# Patient Record
Sex: Female | Born: 1990 | Race: Black or African American | Hispanic: No | Marital: Single | Smoking: Former smoker
Health system: Southern US, Community
[De-identification: ages and names within clinical notes are randomized; demographics above are authoritative.]

## PROBLEM LIST (undated history)

## (undated) DIAGNOSIS — E0500 Thyrotoxicosis with diffuse goiter without thyrotoxic crisis or storm: Secondary | ICD-10-CM

## (undated) DIAGNOSIS — I10 Essential (primary) hypertension: Secondary | ICD-10-CM

## (undated) DIAGNOSIS — F419 Anxiety disorder, unspecified: Secondary | ICD-10-CM

## (undated) DIAGNOSIS — E0590 Thyrotoxicosis, unspecified without thyrotoxic crisis or storm: Secondary | ICD-10-CM

## (undated) HISTORY — DX: Anxiety disorder, unspecified: F41.9

## (undated) HISTORY — PX: OTHER SURGICAL HISTORY: SHX169

## (undated) HISTORY — PX: NO PAST SURGERIES: SHX2092

---
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
## 2011-09-28 ENCOUNTER — Encounter (HOSPITAL_COMMUNITY): Payer: Self-pay | Admitting: Emergency Medicine

## 2011-09-28 ENCOUNTER — Emergency Department (HOSPITAL_COMMUNITY)
Admission: EM | Admit: 2011-09-28 | Discharge: 2011-09-29 | Disposition: A | Discharge status: Home / Self Care | Payer: Managed Care, Other (non HMO) | Attending: Emergency Medicine | Admitting: Emergency Medicine

## 2011-09-28 ENCOUNTER — Emergency Department (HOSPITAL_COMMUNITY)
Admission: EM | Admit: 2011-09-28 | Discharge: 2011-09-28 | Disposition: A | Discharge status: Home / Self Care | Payer: Medicaid Other | Source: Home / Self Care | Attending: Emergency Medicine | Admitting: Emergency Medicine

## 2011-09-28 DIAGNOSIS — E860 Dehydration: Secondary | ICD-10-CM | POA: Insufficient documentation

## 2011-09-28 DIAGNOSIS — Z91013 Allergy to seafood: Secondary | ICD-10-CM | POA: Insufficient documentation

## 2011-09-28 DIAGNOSIS — R1011 Right upper quadrant pain: Secondary | ICD-10-CM | POA: Insufficient documentation

## 2011-09-28 DIAGNOSIS — R109 Unspecified abdominal pain: Secondary | ICD-10-CM

## 2011-09-28 DIAGNOSIS — R1110 Vomiting, unspecified: Secondary | ICD-10-CM | POA: Insufficient documentation

## 2011-09-28 DIAGNOSIS — K819 Cholecystitis, unspecified: Secondary | ICD-10-CM

## 2011-09-28 LAB — URINALYSIS, MICROSCOPIC ONLY
Glucose, UA: NEGATIVE mg/dL
Hgb urine dipstick: NEGATIVE
Ketones, ur: NEGATIVE mg/dL
Leukocytes, UA: NEGATIVE
Nitrite: NEGATIVE
Urobilinogen, UA: 0.2 mg/dL (ref 0.0–1.0)
pH: 6.5 (ref 5.0–8.0)

## 2011-09-28 LAB — CBC WITH DIFFERENTIAL/PLATELET
Basophils Absolute: 0 10*3/uL (ref 0.0–0.1)
Lymphocytes Relative: 44 % (ref 12–46)
Neutro Abs: 3 10*3/uL (ref 1.7–7.7)
Neutrophils Relative %: 49 % (ref 43–77)
Platelets: 372 10*3/uL (ref 150–400)
RDW: 11.4 % — ABNORMAL LOW (ref 11.5–15.5)
WBC: 6.2 10*3/uL (ref 4.0–10.5)

## 2011-09-28 LAB — COMPREHENSIVE METABOLIC PANEL
ALT: 10 U/L (ref 0–35)
AST: 16 U/L (ref 0–37)
CO2: 28 mEq/L (ref 19–32)
Chloride: 103 mEq/L (ref 96–112)
GFR calc non Af Amer: >90 mL/min (ref >90)
Sodium: 140 mEq/L (ref 135–145)
Total Bilirubin: 0.6 mg/dL (ref 0.3–1.2)

## 2011-09-28 MED ORDER — ONDANSETRON HCL 4 MG/2ML IJ SOLN
4.0000 mg | Freq: Once | INTRAMUSCULAR | Status: AC
  Administered 2011-09-29: 4 mg via INTRAVENOUS
  Filled 2011-09-28: qty 2

## 2011-09-28 MED ORDER — SODIUM CHLORIDE 0.9 % IV BOLUS (SEPSIS)
1000.0000 mL | Freq: Once | INTRAVENOUS | Status: AC
  Administered 2011-09-29: 1000 mL via INTRAVENOUS

## 2011-09-28 MED ORDER — HYDROMORPHONE HCL PF 1 MG/ML IJ SOLN
1.0000 mg | Freq: Once | INTRAMUSCULAR | Status: AC
  Administered 2011-09-29: 1 mg via INTRAVENOUS
  Filled 2011-09-28: qty 1

## 2011-09-28 NOTE — ED Provider Notes (Signed)
History     CSN: [id number]  Arrival date & time 09/28/11  2034   First MD Initiated Contact with Patient 09/28/11 2350      Chief Complaint  Patient presents with  . Abdominal Pain    (Consider location/radiation/quality/duration/timing/severity/associated sxs/prior treatment) HPI Comments: 21 year old female with no past medical history who presents with a complaint of right upper quadrant pain. The onset was acute, sharp and stabbing in nature, intermittent and located in the right upper quadrant. It has been associated with 2 episodes of vomiting today, one in the mid afternoon and one just on arrival. She denies any changes in her urinary habits and no changes in bowel patterns, no diarrhea or rectal bleeding and no dysuria though she has noted that her urine has become dark over the last several days. She does admit to having very little appetite over the last week and has had nothing to eat or drink today prior to arrival. She has not had any prior abdominal surgery or internal abdominal problems. She denies being pregnant. She was seen at urgent care prior to arrival and sent for further evaluation.  Patient is a 21 y.o. female presenting with abdominal pain. The history is provided by the patient and medical records.  Abdominal Pain The primary symptoms of the illness include abdominal pain.    History reviewed. No pertinent past medical history.  History reviewed. No pertinent past surgical history.  History reviewed. No pertinent family history.  History  Substance Use Topics  . Smoking status: Never Smoker   . Smokeless tobacco: Not on file  . Alcohol Use: No    OB History    Grav Para Term Preterm Abortions TAB SAB Ect Mult Living                  Review of Systems  Gastrointestinal: Positive for abdominal pain.  All other systems reviewed and are negative.    Allergies  Iodine and Shellfish allergy  Home Medications   Current Outpatient Rx  Name  Route Sig Dispense Refill  . VITAMIN D-3 PO Oral Take 1 tablet by mouth daily.    Marland Kitchen HYDROCORTISONE 1 % EX CREA Topical Apply 1 application topically daily. For eczema    . NORGESTIM-ETH ESTRAD TRIPHASIC 0.18/0.215/0.25 MG-35 MCG PO TABS Oral Take 1 tablet by mouth daily.    Marland Kitchen NAPROXEN 500 MG PO TABS Oral Take 1 tablet (500 mg total) by mouth 2 (two) times daily with a meal. 30 tablet 0  . PROMETHAZINE HCL 25 MG PO TABS Oral Take 1 tablet (25 mg total) by mouth every 6 (six) hours as needed for nausea. 12 tablet 0    BP 104/54  Pulse 79  Temp 98.3 F (36.8 C) (Oral)  Resp 16  SpO2 100%  LMP 09/19/2011  Physical Exam  Nursing note and vitals reviewed. Constitutional: She appears well-developed and well-nourished. No distress.  HENT:  Head: Normocephalic and atraumatic.  Mouth/Throat: Oropharynx is clear and moist. No oropharyngeal exudate.  Eyes: Conjunctivae normal and EOM are normal. Pupils are equal, round, and reactive to light. Right eye exhibits no discharge. Left eye exhibits no discharge. No scleral icterus.  Neck: Normal range of motion. Neck supple. No JVD present. No thyromegaly present.  Cardiovascular: Normal rate, regular rhythm, normal heart sounds and intact distal pulses.  Exam reveals no gallop and no friction rub.   No murmur heard. Pulmonary/Chest: Effort normal and breath sounds normal. No respiratory distress. She has no wheezes. She  has no rales.  Abdominal: Soft. Bowel sounds are normal. She exhibits no distension and no mass. There is tenderness ( Focal right upper quadrant tenderness with guarding).       No pain at McBurney's point, no CVA tenderness, no other abdominal tenderness, normal bowel sounds  Musculoskeletal: Normal range of motion. She exhibits no edema and no tenderness.  Lymphadenopathy:    She has no cervical adenopathy.  Neurological: She is alert. Coordination normal.  Skin: Skin is warm and dry. No rash noted. No erythema.  Psychiatric: She  has a normal mood and affect. Her behavior is normal.    ED Course  Procedures (including critical care time)  Labs Reviewed  CBC WITH DIFFERENTIAL - Abnormal; Notable for the following:    RDW 11.4 (*)     All other components within normal limits  URINALYSIS, WITH MICROSCOPIC - Abnormal; Notable for the following:    APPearance CLOUDY (*)     Squamous Epithelial / LPF MANY (*)     All other components within normal limits  COMPREHENSIVE METABOLIC PANEL  LIPASE, BLOOD  HCG, SERUM, QUALITATIVE  LAB REPORT - SCANNED   US Abdomen Complete  09/29/2011  *RADIOLOGY REPORT*  Clinical Data:  21 year old female with right upper quadrant abdominal pain, nausea and vomiting.  ABDOMINAL ULTRASOUND COMPLETE  Comparison:  None.  Findings:  Gallbladder:  The gallbladder is unremarkable. There is no evidence of gallstones, gallbladder wall thickening, or pericholecystic fluid.  Common Bile Duct:  There is no evidence of intrahepatic or extrahepatic biliary dilation. The CBD measures 4.2 mm in greatest diameter.  Liver:  The liver is within normal limits in parenchymal echogenicity. No focal abnormalities are identified.  IVC:  Appears normal.  Pancreas:  Although the pancreas is difficult to visualize in its entirety, no focal pancreatic abnormality is identified.  Spleen:  Within normal limits in size and echotexture.  Right kidney:  The right kidney is normal in size and parenchymal echogenicity.  There is no evidence of solid mass, hydronephrosis or definite renal calculi.  The right kidney measures 9.1 cm.  Left kidney:  The left kidney is normal in size and parenchymal echogenicity.  There is no evidence of solid mass, hydronephrosis or definite renal calculi.   The left kidney measures 9.9 cm.  Abdominal Aorta:  No abdominal aortic aneurysm identified.  There is no evidence of ascites.  IMPRESSION: Negative abdominal ultrasound.   Original Report Authenticated By: Rosendo Gros, M.D.      1. Abdominal  pain       MDM  Overall the patient is in no acute distress though she does likely have some mild dehydration secondary to decreased intake, also possibly has cholecystitis no review of her lab work shows a normal white blood cell count of 6200, normal lipase and normal liver function tests. Her urinalysis shows mild dehydration but no ketones, serum hCG pending.  Abdominal pain improved, patient aware of her results including an ultrasound showing no signs of acute cholecystitis, patient stable for discharge.      Vida Roller, MD 09/29/11 [phone]

## 2011-09-28 NOTE — ED Notes (Signed)
C/o mid abd pain that began this afternoon. Denies vag d/c or urinary freq or burning.

## 2011-09-28 NOTE — ED Provider Notes (Signed)
Chief Complaint  Patient presents with  . Abdominal Pain    History of Present Illness:    The patient is a 21 year old female with a history since last night a sharp, stabbing, severe right upper quadrant abdominal pain without radiation. The pain went away then came back again. It is rated 9/10 in intensity. Nothing makes it worse. Specifically is not worse with food or with eating. She did get some relief of vomiting on one occasion. She denies any fever, chills, or sweats. She has felt nauseated and vomited 2 or 3 times. No hematemesis, coffee-ground emesis, or bilious emesis. She denies constipation, diarrhea, hematochezia, or melena. No urinary symptoms. No GYN complaints. Last menstrual period started last Wednesday. She is sexually active and uses birth control pills. She has a history since she was in high school of excessive vomiting. She had a extensive workup for this including endoscopy and a CT scan. Nothing was found it was felt that this might be due to stress. There is a positive family history of gallstones. She is allergic to shellfish and iodine.  Review of Systems:  Other than noted above, the patient denies any of the following symptoms: Constitutional:  No fever, chills, fatigue, weight loss or anorexia. Lungs:  No cough or shortness of breath. Heart:  No chest pain, palpitations, syncope or edema.  No cardiac history. Abdomen:  No nausea, vomiting, hematememesis, melena, diarrhea, or hematochezia. GU:  No dysuria, frequency, urgency, or hematuria. Gyn:  No vaginal discharge, itching, abnormal bleeding, dyspareunia, or pelvic pain.  PMFSH:  Past medical history, family history, social history, meds, and allergies were reviewed along with nurse's notes.  No prior abdominal surgeries, past history of GI problems, STDs or GYN problems.  No history of aspirin or NSAID use.  No excessive alcohol intake.  Physical Exam:   Vital signs:  BP 131/85  Pulse 85  Temp 98.9 F (37.2 C)  (Oral)  Resp 16  SpO2 98%  LMP 09/19/2011 Gen:  Alert, oriented, in no distress. Lungs:  Breath sounds clear and equal bilaterally.  No wheezes, rales or rhonchi. Heart:  Regular rhythm.  No gallops or murmers.   Abdomen:  She has localized tenderness to palpation in the right upper quadrant with guarding and rebound. She has positive Murphy sign and Murphy's punch. Abdomen is flat and nondistended. Bowel sounds are present. No organomegaly or mass. Skin:  Clear, warm and dry.  No rash.   Assessment:  The encounter diagnosis was Cholecystitis.  Differential diagnosis includes ulcer disease or gallstones. She will need an ultrasound tonight.  Plan:   1.  The following meds were prescribed:   New Prescriptions   No medications on file   2.  The patient was transferred to the emergency department via shuttle.  Reuben Likes, MD 09/28/11 2030

## 2011-09-28 NOTE — ED Notes (Signed)
Reports severe pain in abd last night, started to have n/v today, and pain started to get better, but later this afternoon, severe pain came back--pain in RUQ

## 2011-09-29 ENCOUNTER — Emergency Department (HOSPITAL_COMMUNITY): Payer: Managed Care, Other (non HMO)

## 2011-09-29 MED ORDER — NAPROXEN 500 MG PO TABS: 500.0000 mg | ORAL_TABLET | Freq: Two times a day (BID) | ORAL | Status: AC

## 2011-09-29 MED ORDER — PROMETHAZINE HCL 25 MG PO TABS: 25.0000 mg | ORAL_TABLET | Freq: Four times a day (QID) | ORAL | Status: DC | PRN

## 2011-09-29 NOTE — ED Notes (Signed)
Pt from [facility] for ultrasound - pt reports RUQ w/ associating n/v that began yesterday evening. Pt denies diarrhea or fever.

## 2011-12-04 ENCOUNTER — Encounter (HOSPITAL_COMMUNITY): Payer: Self-pay | Admitting: *Deleted

## 2011-12-04 ENCOUNTER — Emergency Department (HOSPITAL_COMMUNITY)
Admission: EM | Admit: 2011-12-04 | Discharge: 2011-12-04 | Disposition: A | Discharge status: Home / Self Care | Payer: Managed Care, Other (non HMO) | Source: Home / Self Care | Attending: Family Medicine | Admitting: Family Medicine

## 2011-12-04 DIAGNOSIS — K5289 Other specified noninfective gastroenteritis and colitis: Secondary | ICD-10-CM

## 2011-12-04 DIAGNOSIS — K529 Noninfective gastroenteritis and colitis, unspecified: Secondary | ICD-10-CM

## 2011-12-04 LAB — POCT URINALYSIS DIP (DEVICE)
Bilirubin Urine: NEGATIVE
Ketones, ur: NEGATIVE mg/dL
Leukocytes, UA: NEGATIVE
Specific Gravity, Urine: 1.025 (ref 1.005–1.030)
pH: 7 (ref 5.0–8.0)

## 2011-12-04 LAB — POCT PREGNANCY, URINE: Preg Test, Ur: NEGATIVE

## 2011-12-04 MED ORDER — ONDANSETRON 4 MG PO TBDP
8.0000 mg | ORAL_TABLET | Freq: Once | ORAL | Status: AC
  Administered 2011-12-04: 8 mg via ORAL

## 2011-12-04 MED ORDER — ONDANSETRON HCL 4 MG/2ML IJ SOLN
INTRAMUSCULAR | Status: AC
  Filled 2011-12-04: qty 2

## 2011-12-04 MED ORDER — ONDANSETRON HCL 4 MG/2ML IJ SOLN
4.0000 mg | Freq: Once | INTRAMUSCULAR | Status: AC
  Administered 2011-12-04: 4 mg via INTRAMUSCULAR

## 2011-12-04 MED ORDER — METOCLOPRAMIDE HCL 5 MG/ML IJ SOLN
10.0000 mg | Freq: Once | INTRAMUSCULAR | Status: AC
  Administered 2011-12-04: 10 mg via INTRAMUSCULAR

## 2011-12-04 MED ORDER — ONDANSETRON 4 MG PO TBDP
ORAL_TABLET | ORAL | Status: AC
  Filled 2011-12-04: qty 2

## 2011-12-04 MED ORDER — ONDANSETRON HCL 4 MG PO TABS: 4.0000 mg | ORAL_TABLET | Freq: Four times a day (QID) | ORAL | Status: DC

## 2011-12-04 MED ORDER — METOCLOPRAMIDE HCL 5 MG/ML IJ SOLN
INTRAMUSCULAR | Status: AC
  Filled 2011-12-04: qty 2

## 2011-12-04 NOTE — ED Provider Notes (Signed)
History     CSN: [id number]  Arrival date & time 12/04/11  1421   None     Chief Complaint  Patient presents with  . Diarrhea    (Consider location/radiation/quality/duration/timing/severity/associated sxs/prior treatment) Patient is a 21 y.o. female presenting with diarrhea. The history is provided by the patient.  Diarrhea The primary symptoms include nausea, vomiting and diarrhea. The illness began 3 to 5 days ago. The onset was sudden. The problem has not changed since onset. The diarrhea is watery, semi-solid and malodorous. The diarrhea occurs 2 to 4 times per day. Risk factors for illness producing diarrhea include suspect food intake.  The illness is also significant for chills and tenesmus. The illness does not include anorexia, bloating, constipation or back pain. Associated medical issues do not include inflammatory bowel disease, GERD, gallstones, liver disease, bowel resection, irritable bowel syndrome or diverticulitis.    History reviewed. No pertinent past medical history.  History reviewed. No pertinent past surgical history.  No family history on file.  History  Substance Use Topics  . Smoking status: Never Smoker   . Smokeless tobacco: Not on file  . Alcohol Use: No    OB History    Grav Para Term Preterm Abortions TAB SAB Ect Mult Living                  Review of Systems  Constitutional: Positive for chills.  Gastrointestinal: Positive for nausea, vomiting and diarrhea. Negative for constipation, bloating and anorexia.  Musculoskeletal: Negative for back pain.  All other systems reviewed and are negative.    Allergies  Iodine and Shellfish allergy  Home Medications   Current Outpatient Rx  Name  Route  Sig  Dispense  Refill  . VITAMIN D-3 PO   Oral   Take 1 tablet by mouth daily.         Marland Kitchen HYDROCORTISONE 1 % EX CREA   Topical   Apply 1 application topically daily. For eczema         . NAPROXEN 500 MG PO TABS   Oral   Take 1  tablet (500 mg total) by mouth 2 (two) times daily with a meal.   30 tablet   0   . NORGESTIM-ETH ESTRAD TRIPHASIC 0.18/0.215/0.25 MG-35 MCG PO TABS   Oral   Take 1 tablet by mouth daily.         Marland Kitchen ONDANSETRON HCL 4 MG PO TABS   Oral   Take 1 tablet (4 mg total) by mouth every 6 (six) hours.   12 tablet   0   . PROMETHAZINE HCL 25 MG PO TABS   Oral   Take 1 tablet (25 mg total) by mouth every 6 (six) hours as needed for nausea.   12 tablet   0     BP 120/85  Pulse 85  Temp 98.8 F (37.1 C) (Oral)  Resp 16  SpO2 99%  LMP 11/18/2011  Physical Exam  Nursing note and vitals reviewed. Constitutional: She is oriented to person, place, and time. Vital signs are normal. She appears well-developed and well-nourished. She is active and cooperative.  HENT:  Head: Normocephalic.  Mouth/Throat: Oropharynx is clear and moist. No oropharyngeal exudate.  Eyes: Conjunctivae normal are normal. Pupils are equal, round, and reactive to light. No scleral icterus.  Neck: Trachea normal and normal range of motion. Neck supple.  Cardiovascular: Normal rate, regular rhythm, normal heart sounds and intact distal pulses.   Pulmonary/Chest: Effort normal and breath sounds  normal.  Abdominal: Soft. Bowel sounds are normal. There is tenderness. There is no guarding.  Musculoskeletal: Normal range of motion.  Lymphadenopathy:    She has no cervical adenopathy.  Neurological: She is alert and oriented to person, place, and time. No cranial nerve deficit or sensory deficit.  Skin: Skin is warm and dry.  Psychiatric: She has a normal mood and affect. Her speech is normal and behavior is normal. Judgment and thought content normal. Cognition and memory are normal.    ED Course  Procedures (including critical care time)  Labs Reviewed  POCT URINALYSIS DIP (DEVICE) - Abnormal; Notable for the following:    Protein, ur 30 (*)     All other components within normal limits  POCT PREGNANCY, URINE    No results found.   1. Gastroenteritis       MDM  Zofran ODT administered in office, unable to tolerate PO fluids Zofran 4mg  IM administered, vomiting post administration. Reglan 10mg  administered, oral trial, tolerated well.  Driver at bedside. Rx provided.  Clear liquid diet, then BRAT, progress to regular as tolerated.         Johnsie Kindred, NP 12/04/11 1706  Johnsie Kindred, NP 12/04/11 1926

## 2011-12-04 NOTE — ED Provider Notes (Signed)
Medical screening examination/treatment/procedure(s) were performed by resident physician or non-physician practitioner and as supervising physician I was immediately available for consultation/collaboration.   Vyom Brass DOUGLAS MD.    Lailynn Southgate D Yomar Mejorado, MD 12/04/11 2132 

## 2011-12-04 NOTE — ED Notes (Signed)
Pt  Reports  Symptoms  Of  abd  Pain  With  Nausea   Vomiting  And  Diarrhea    X  4  Days  Pt  And  Friend  Got  Sick  sev  Hours  After  Eating  [place]  She  States  -  She  Vomited  yest   However  None  Today  But has  Nausea   And  some  Diarrhea

## 2011-12-04 NOTE — ED Notes (Signed)
Report     Given to  [facility]

## 2011-12-04 NOTE — ED Notes (Signed)
Pt   Vomited      After  Po  zofram  carman  notified

## 2011-12-05 NOTE — ED Notes (Signed)
Called, tearful; c/o she is not able to keep down any PO's at all, that the Zofran PO is not helping her nausea at all advised if unable to keep down fluids , she should be rechecked

## 2011-12-06 ENCOUNTER — Emergency Department (HOSPITAL_COMMUNITY): Payer: Managed Care, Other (non HMO)

## 2011-12-06 ENCOUNTER — Emergency Department (HOSPITAL_COMMUNITY)
Admission: EM | Admit: 2011-12-06 | Discharge: 2011-12-07 | Disposition: A | Discharge status: Home / Self Care | Payer: Managed Care, Other (non HMO) | Attending: Emergency Medicine | Admitting: Emergency Medicine

## 2011-12-06 ENCOUNTER — Encounter (HOSPITAL_COMMUNITY): Payer: Self-pay | Admitting: Emergency Medicine

## 2011-12-06 DIAGNOSIS — R112 Nausea with vomiting, unspecified: Secondary | ICD-10-CM | POA: Insufficient documentation

## 2011-12-06 DIAGNOSIS — R109 Unspecified abdominal pain: Secondary | ICD-10-CM | POA: Insufficient documentation

## 2011-12-06 DIAGNOSIS — K5900 Constipation, unspecified: Secondary | ICD-10-CM | POA: Insufficient documentation

## 2011-12-06 DIAGNOSIS — Z79899 Other long term (current) drug therapy: Secondary | ICD-10-CM | POA: Insufficient documentation

## 2011-12-06 LAB — COMPREHENSIVE METABOLIC PANEL
ALT: 11 U/L (ref 0–35)
AST: 15 U/L (ref 0–37)
Alkaline Phosphatase: 61 U/L (ref 39–117)
CO2: 26 mEq/L (ref 19–32)
Calcium: 9.2 mg/dL (ref 8.4–10.5)
Chloride: 102 mEq/L (ref 96–112)
GFR calc non Af Amer: >90 mL/min (ref >90)
Potassium: 3.3 mEq/L — ABNORMAL LOW (ref 3.5–5.1)
Sodium: 138 mEq/L (ref 135–145)
Total Bilirubin: 0.8 mg/dL (ref 0.3–1.2)

## 2011-12-06 LAB — WET PREP, GENITAL

## 2011-12-06 LAB — URINALYSIS, ROUTINE W REFLEX MICROSCOPIC
Hgb urine dipstick: NEGATIVE
Nitrite: NEGATIVE
Specific Gravity, Urine: 1.028 (ref 1.005–1.030)
Urobilinogen, UA: 1 mg/dL (ref 0.0–1.0)
pH: 6 (ref 5.0–8.0)

## 2011-12-06 LAB — CBC WITH DIFFERENTIAL/PLATELET
Basophils Absolute: 0 10*3/uL (ref 0.0–0.1)
Lymphocytes Relative: 40 % (ref 12–46)
Neutro Abs: 3.3 10*3/uL (ref 1.7–7.7)
Platelets: 355 10*3/uL (ref 150–400)
RBC: 3.79 MIL/uL — ABNORMAL LOW (ref 3.87–5.11)
RDW: 11.3 % — ABNORMAL LOW (ref 11.5–15.5)
WBC: 6.1 10*3/uL (ref 4.0–10.5)

## 2011-12-06 LAB — URINE MICROSCOPIC-ADD ON

## 2011-12-06 LAB — PREGNANCY, URINE: Preg Test, Ur: NEGATIVE

## 2011-12-06 MED ORDER — PIPERACILLIN-TAZOBACTAM 3.375 G IVPB 30 MIN
3.3750 g | Freq: Once | INTRAVENOUS | Status: AC
  Administered 2011-12-06: 3.375 g via INTRAVENOUS
  Filled 2011-12-06: qty 50

## 2011-12-06 MED ORDER — MORPHINE SULFATE 4 MG/ML IJ SOLN
4.0000 mg | Freq: Once | INTRAMUSCULAR | Status: AC
  Administered 2011-12-06: 4 mg via INTRAVENOUS
  Filled 2011-12-06: qty 1

## 2011-12-06 MED ORDER — SODIUM CHLORIDE 0.9 % IV BOLUS (SEPSIS)
1000.0000 mL | Freq: Once | INTRAVENOUS | Status: AC
  Administered 2011-12-06: 1000 mL via INTRAVENOUS

## 2011-12-06 MED ORDER — POTASSIUM CHLORIDE 10 MEQ/100ML IV SOLN
10.0000 meq | INTRAVENOUS | Status: DC
  Administered 2011-12-06: 10 meq via INTRAVENOUS
  Filled 2011-12-06: qty 100

## 2011-12-06 MED ORDER — HYDROMORPHONE HCL PF 2 MG/ML IJ SOLN
2.0000 mg | INTRAMUSCULAR | Status: DC | PRN
  Administered 2011-12-06 (×2): 2 mg via INTRAVENOUS
  Filled 2011-12-06 (×2): qty 1

## 2011-12-06 MED ORDER — HYDROMORPHONE HCL PF 1 MG/ML IJ SOLN
1.0000 mg | Freq: Once | INTRAMUSCULAR | Status: AC
  Administered 2011-12-06: 1 mg via INTRAVENOUS
  Filled 2011-12-06: qty 1

## 2011-12-06 MED ORDER — ONDANSETRON HCL 4 MG/2ML IJ SOLN
4.0000 mg | Freq: Once | INTRAMUSCULAR | Status: AC
  Administered 2011-12-06: 4 mg via INTRAVENOUS
  Filled 2011-12-06: qty 2

## 2011-12-06 NOTE — ED Provider Notes (Signed)
Lab, CT, and [place] results reviewed, discussed with Dr. Patria Mane and discussed with patient.  No clear cause of abdominal pain identified.  Based on symptom presentation of diarrhea followed with nausea and vomiting and cramping pain, suspect gastroenteritis.  Will provide prescription for anti-emetic and analgesia.  Clear liquids for 24 hours, progressing diet as tolerated as symptom resolution allows.  Jimmye Norman, NP 12/06/11 2350

## 2011-12-06 NOTE — ED Notes (Signed)
Patient transported to Ultrasound 

## 2011-12-06 NOTE — ED Provider Notes (Signed)
History     CSN: [id number]  Arrival date & time 12/06/11  1622   First MD Initiated Contact with Patient 12/06/11 1629      Chief Complaint  Patient presents with  . Abdominal Pain  . Emesis    (Consider location/radiation/quality/duration/timing/severity/associated sxs/prior treatment) HPI  Miranda Adams is a 21 y.o. female complaining of  nausea vomiting and lower abdominal pain for 7 days. Patient was seen at urgent care and given Zofran with little relief. She has been constipated denies any change in her urination other than the urine is dark. Patient denies fever, decreased appetite, vaginal discharge. She's not tolerating any by mouth at this time. Pain is rated as severe 8/10.   History reviewed. No pertinent past medical history.  History reviewed. No pertinent past surgical history.  History reviewed. No pertinent family history.  History  Substance Use Topics  . Smoking status: Never Smoker   . Smokeless tobacco: Not on file  . Alcohol Use: No    OB History    Grav Para Term Preterm Abortions TAB SAB Ect Mult Living                  Review of Systems  Constitutional: Negative for fever.  Respiratory: Negative for shortness of breath.   Cardiovascular: Negative for chest pain.  Gastrointestinal: Positive for nausea, vomiting, abdominal pain and constipation. Negative for diarrhea.  All other systems reviewed and are negative.    Allergies  Iodine and Shellfish allergy  Home Medications   Current Outpatient Rx  Name  Route  Sig  Dispense  Refill  . VITAMIN D-3 PO   Oral   Take 1 tablet by mouth daily.         Marland Kitchen HYDROCORTISONE 1 % EX CREA   Topical   Apply 1 application topically daily. For eczema         . NAPROXEN 500 MG PO TABS   Oral   Take 1 tablet (500 mg total) by mouth 2 (two) times daily with a meal.   30 tablet   0   . NORGESTIM-ETH ESTRAD TRIPHASIC 0.18/0.215/0.25 MG-35 MCG PO TABS   Oral   Take 1 tablet by mouth  daily.         Marland Kitchen ONDANSETRON HCL 4 MG PO TABS   Oral   Take 4 mg by mouth every 6 (six) hours as needed. For nausea/vomiting           BP 138/62  Pulse 83  Temp 98.1 F (36.7 C) (Oral)  Resp 16  SpO2 100%  LMP 11/18/2011  Physical Exam  Nursing note and vitals reviewed. Constitutional: She is oriented to person, place, and time. She appears well-developed and well-nourished. No distress.  HENT:  Head: Normocephalic.  Eyes: Conjunctivae normal and EOM are normal.  Cardiovascular: Normal rate, regular rhythm and intact distal pulses.   Pulmonary/Chest: Breath sounds normal. No stridor. No respiratory distress. She has no wheezes. She has no rales. She exhibits no tenderness.  Abdominal: Soft. Bowel sounds are normal. She exhibits no distension and no mass. There is tenderness. There is no rebound and no guarding.       TTP of RLQ, -rovsing, +psoas, +obturator  Genitourinary:       Pelvic exam chaperoned by RN. No abnormal discharge, cervical motion or adnexal tenderness.  Musculoskeletal: Normal range of motion.  Neurological: She is alert and oriented to person, place, and time.  Psychiatric: She has a normal mood and affect.  ED Course  Procedures (including critical care time)  Labs Reviewed  URINALYSIS, ROUTINE W REFLEX MICROSCOPIC - Abnormal; Notable for the following:    Color, Urine AMBER (*)  BIOCHEMICALS MAY BE AFFECTED BY COLOR   APPearance CLOUDY (*)     Bilirubin Urine SMALL (*)     Ketones, ur 15 (*)     Protein, ur 30 (*)     All other components within normal limits  CBC WITH DIFFERENTIAL - Abnormal; Notable for the following:    RBC 3.79 (*)     Hemoglobin 11.7 (*)     HCT 33.9 (*)     RDW 11.3 (*)     All other components within normal limits  COMPREHENSIVE METABOLIC PANEL - Abnormal; Notable for the following:    Potassium 3.3 (*)     Glucose, Bld 113 (*)     All other components within normal limits  WET PREP, GENITAL - Abnormal; Notable  for the following:    Clue Cells Wet Prep HPF POC FEW (*)     WBC, Wet Prep HPF POC FEW (*)     All other components within normal limits  URINE MICROSCOPIC-ADD ON - Abnormal; Notable for the following:    Squamous Epithelial / LPF FEW (*)     Bacteria, UA FEW (*)     Casts HYALINE CASTS (*)  GRANULAR CAST   All other components within normal limits  PREGNANCY, URINE  POCT PREGNANCY, URINE   No results found.   No diagnosis found.    MDM  Pt with 7 days of N/V and abdominal pain.   5:57 PM Pt relavuated after morphine given pain is still 8/10. Emesis has resolved but she remains nauseated. We'll write her for 1 mg of Dilaudid.  Patient's pain remains severe at 8/10. I will write her for 2 mg of Dilaudid at this time.  GC and Chlamydia canceled as sample  was obtained on incorrect swab.  Concern for appendicitis. I will start her on Zosyn in order CT abdomen pelvis.   Patient's pain remains severe. She is unable to drink her oral contrast. I have spoken with the radiologist Dr. Pia Mau who has said that she would be appropriate for an Non-contrast  Patient will be transferred to the CDU pending imaging. Report given to NP [facility]  in the CDU.      Wynetta Emery, PA-C 12/08/11 [phone]

## 2011-12-06 NOTE — ED Notes (Signed)
Pt c/o lower abd pain with N/V x 1 week; pt sts LMP was 11/3; pt sts seen at [facility] this week for same

## 2011-12-07 MED ORDER — ONDANSETRON 8 MG PO TBDP: 8.0000 mg | ORAL_TABLET | Freq: Three times a day (TID) | ORAL | Status: DC | PRN

## 2011-12-07 MED ORDER — HYDROCODONE-ACETAMINOPHEN 5-325 MG PO TABS: 1.0000 | ORAL_TABLET | Freq: Four times a day (QID) | ORAL | Status: DC | PRN

## 2011-12-07 NOTE — ED Notes (Signed)
Friend out to nursing area and states that pt "passed out in the bathroom" and was "unresponsive for about 3 minutes."  Found pt lying on floor in bathroom alert and oriented.  Pt denies pain other than previous abd pain that she was having.  No obvious signs of injury.Denies hitting her head.  Two friends were with pt during incident and friend states that pt did not fall.  Friend reports that pt started to pass out and that she caught her and lowered her to the ground.  Denies fall and denies any injury.  Pt assisted back to bed and vitals taken.  Onalee Hua, NP notified of same. No new orders received.

## 2011-12-07 NOTE — ED Notes (Signed)
Friends report that pt is "going in and out of consciousness and shaking."  Pt is alert and oriented and talking to RN.  Pt states she is shaking because she is cold.  Warm blankets given and vitals WNL. Dr. Lavella Lemons notified of pt and will re-evaluate.

## 2011-12-07 NOTE — ED Provider Notes (Signed)
Medical screening examination/treatment/procedure(s) were conducted as a shared visit with non-physician practitioner(s) and myself.  I personally evaluated the patient during the encounter  Sent to CDU to await CT scan  Lyanne Co, MD 12/07/11 0004

## 2011-12-07 NOTE — ED Notes (Signed)
Dr. Lavella Lemons in to inform pt of normal test results.  Pt verbalized understanding.  Pt to discharge via wheelchair without any difficulties.

## 2011-12-08 ENCOUNTER — Encounter (HOSPITAL_COMMUNITY): Payer: Self-pay | Admitting: Emergency Medicine

## 2011-12-08 ENCOUNTER — Emergency Department (HOSPITAL_COMMUNITY)
Admission: EM | Admit: 2011-12-08 | Discharge: 2011-12-09 | Disposition: A | Discharge status: Home / Self Care | Payer: Managed Care, Other (non HMO) | Attending: Emergency Medicine | Admitting: Emergency Medicine

## 2011-12-08 DIAGNOSIS — R55 Syncope and collapse: Secondary | ICD-10-CM | POA: Insufficient documentation

## 2011-12-08 DIAGNOSIS — R51 Headache: Secondary | ICD-10-CM | POA: Insufficient documentation

## 2011-12-08 DIAGNOSIS — R110 Nausea: Secondary | ICD-10-CM | POA: Insufficient documentation

## 2011-12-08 DIAGNOSIS — Z3202 Encounter for pregnancy test, result negative: Secondary | ICD-10-CM | POA: Insufficient documentation

## 2011-12-08 DIAGNOSIS — R109 Unspecified abdominal pain: Secondary | ICD-10-CM | POA: Insufficient documentation

## 2011-12-08 LAB — URINALYSIS, ROUTINE W REFLEX MICROSCOPIC
Hgb urine dipstick: NEGATIVE
Nitrite: NEGATIVE
Protein, ur: 100 mg/dL — AB
Specific Gravity, Urine: 1.029 (ref 1.005–1.030)
Urobilinogen, UA: 2 mg/dL — ABNORMAL HIGH (ref 0.0–1.0)

## 2011-12-08 LAB — CBC WITH DIFFERENTIAL/PLATELET
Basophils Absolute: 0 10*3/uL (ref 0.0–0.1)
Basophils Relative: 0 % (ref 0–1)
Eosinophils Absolute: 0 10*3/uL (ref 0.0–0.7)
Eosinophils Relative: 1 % (ref 0–5)
MCH: 30.1 pg (ref 26.0–34.0)
MCHC: 34.3 g/dL (ref 30.0–36.0)
MCV: 87.8 fL (ref 78.0–100.0)
Neutrophils Relative %: 58 % (ref 43–77)
Platelets: 363 10*3/uL (ref 150–400)
RBC: 3.85 MIL/uL — ABNORMAL LOW (ref 3.87–5.11)
RDW: 11.4 % — ABNORMAL LOW (ref 11.5–15.5)

## 2011-12-08 LAB — BASIC METABOLIC PANEL
BUN: 7 mg/dL (ref 6–23)
CO2: 24 mEq/L (ref 19–32)
Calcium: 9.2 mg/dL (ref 8.4–10.5)
Creatinine, Ser: 0.81 mg/dL (ref 0.50–1.10)
GFR calc non Af Amer: >90 mL/min (ref >90)
Glucose, Bld: 82 mg/dL (ref 70–99)
Sodium: 137 mEq/L (ref 135–145)

## 2011-12-08 LAB — URINE MICROSCOPIC-ADD ON

## 2011-12-08 MED ORDER — HYOSCYAMINE SULFATE CR 0.375 MG PO CP12
0.3750 mg | ORAL_CAPSULE | Freq: Two times a day (BID) | ORAL | Status: DC | PRN
Start: 2011-12-08 — End: 2014-03-10

## 2011-12-08 MED ORDER — HYOSCYAMINE SULFATE 0.125 MG PO TABS
0.2500 mg | ORAL_TABLET | Freq: Once | ORAL | Status: AC
  Administered 2011-12-08: 0.25 mg via ORAL
  Filled 2011-12-08: qty 2

## 2011-12-08 MED ORDER — SODIUM CHLORIDE 0.9 % IV SOLN: INTRAVENOUS | Status: DC

## 2011-12-08 MED ORDER — SODIUM CHLORIDE 0.9 % IV BOLUS (SEPSIS)
1000.0000 mL | Freq: Once | INTRAVENOUS | Status: AC
  Administered 2011-12-08: 1000 mL via INTRAVENOUS

## 2011-12-08 MED ORDER — ONDANSETRON HCL 4 MG/2ML IJ SOLN
4.0000 mg | Freq: Once | INTRAMUSCULAR | Status: AC
  Administered 2011-12-08: 4 mg via INTRAVENOUS
  Filled 2011-12-08: qty 2

## 2011-12-08 NOTE — ED Provider Notes (Addendum)
History     CSN: [id number]  Arrival date & time 12/08/11  2048   First MD Initiated Contact with Patient 12/08/11 2129      Chief Complaint  Patient presents with  . Near Syncope    (Consider location/radiation/quality/duration/timing/severity/associated sxs/prior treatment) The history is provided by the patient and medical records.   the patient is a 21 year old, female, who presents to emergency department after she had a syncopal episode while at work.  She is a Child psychotherapist.  She says that she was working and she became hot and then, she fainted.  She states that she has had abdominal cramping, with nausea, vomiting, and diarrhea for the past week.  She says that she still has nausea and vomiting, but the diarrhea is now resolved.  She denies chest pain, shortness of breath.  Cough.  She denies fevers, chills, dysuria, or hematuria.  She denies vaginal bleeding or discharge.  History reviewed. No pertinent past medical history.  History reviewed. No pertinent past surgical history.  No family history on file.  History  Substance Use Topics  . Smoking status: Never Smoker   . Smokeless tobacco: Not on file  . Alcohol Use: No    OB History    Grav Para Term Preterm Abortions TAB SAB Ect Mult Living                  Review of Systems  Constitutional: Negative for fever, chills and diaphoresis.  HENT: Negative for nosebleeds.   Eyes: Negative for visual disturbance.  Respiratory: Negative for cough and shortness of breath.   Cardiovascular: Negative for chest pain and palpitations.  Gastrointestinal: Positive for nausea, vomiting and abdominal pain. Negative for diarrhea, constipation and blood in stool.  Genitourinary: Negative for dysuria, hematuria, vaginal bleeding and vaginal discharge.  Musculoskeletal: Negative for back pain.  Skin: Negative for rash.  Neurological: Positive for syncope and headaches.       Headache is only mild  Hematological: Does not  bruise/bleed easily.  Psychiatric/Behavioral: Negative for confusion.  All other systems reviewed and are negative.    Allergies  Iodine and Shellfish allergy  Home Medications   Current Outpatient Rx  Name  Route  Sig  Dispense  Refill  . HYDROCORTISONE 1 % EX CREA   Topical   Apply 1 application topically daily. For eczema         . NAPROXEN 500 MG PO TABS   Oral   Take 1 tablet (500 mg total) by mouth 2 (two) times daily with a meal.   30 tablet   0   . NORGESTIM-ETH ESTRAD TRIPHASIC 0.18/0.215/0.25 MG-35 MCG PO TABS   Oral   Take 1 tablet by mouth daily.         Marland Kitchen ONDANSETRON HCL 4 MG PO TABS   Oral   Take 4 mg by mouth every 6 (six) hours as needed. For nausea/vomiting           BP 125/69  Pulse 84  Temp 98.6 F (37 C) (Oral)  Resp 16  Ht 5\' 1"  (1.549 m)  Wt 145 lb (65.772 kg)  BMI 27.40 kg/m2  SpO2 100%  LMP 11/18/2011  Physical Exam  Nursing note and vitals reviewed. Constitutional: She is oriented to person, place, and time. She appears well-developed and well-nourished. No distress.  HENT:  Head: Normocephalic and atraumatic.  Eyes: Conjunctivae normal and EOM are normal.  Neck: Normal range of motion. Neck supple. No JVD present.  No carotid bruits  Cardiovascular: Normal rate, regular rhythm and intact distal pulses.   No murmur heard. Pulmonary/Chest: Effort normal and breath sounds normal. No respiratory distress. She has no rales.  Abdominal: Soft. Bowel sounds are normal. She exhibits no distension. There is tenderness. There is no rebound and no guarding.       Mild epigastric tenderness, with no peritoneal sign  Musculoskeletal: Normal range of motion. She exhibits no edema and no tenderness.  Neurological: She is alert and oriented to person, place, and time. No cranial nerve deficit.  Skin: Skin is warm and dry.  Psychiatric: She has a normal mood and affect. Thought content normal.    ED Course  Procedures (including  critical care time) 21 year old, female, had a syncopal episode while at work.  Recently, she had gastroenteritis.  Now.  Her diarrhea is resolved, but she still has nausea and vomiting.  We'll establish an IV give fluids, and perform laboratory testing, or evaluation   Labs Reviewed  URINALYSIS, ROUTINE W REFLEX MICROSCOPIC  PREGNANCY, URINE  BASIC METABOLIC PANEL  CBC WITH DIFFERENTIAL   US Transvaginal Non-ob  12/06/2011  *RADIOLOGY REPORT*  Clinical Data: Right lower quadrant pain  TRANSABDOMINAL AND TRANSVAGINAL ULTRASOUND OF PELVIS Technique:  Both transabdominal and transvaginal ultrasound examinations of the pelvis were performed. Transabdominal technique was performed for global imaging of the pelvis including uterus, ovaries, adnexal regions, and pelvic cul-de-sac.  It was necessary to proceed with endovaginal exam following the transabdominal exam to visualize the endometrium and adnexa.  Comparison:  12/06/2011 CT  Findings:  Uterus: Retroflexed with an exophytic fibroid anteriorly and posteriorly, measuring 1.2 cm and 1.1 cm respectively.  The uterus measures 5.5 x 3.7 x 4.4 cm.  Endometrium: Normal in thickness and appearance, measuring 3 mm.  Right ovary:  Normal appearance/no adnexal mass.  The ovary measures 3.5 x 2.5 x 2.5 cm.  Left ovary: Normal appearance/no adnexal mass.  The ovary measures 4.1 x 2.0 x 2.0 cm.  1.4 cm corpus luteal cyst.  Other findings: Small amount of free fluid within the cul-de-sac.  Color Doppler flow with arterial and venous waveforms documented to both ovaries.  IMPRESSION: Left corpus luteal cyst and small amount of free fluid within the pelvis, likely physiologic.  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  Color Doppler flow with arterial and venous waveforms documented to both ovaries.   Original Report Authenticated By: Jearld Lesch, M.D.    US Pelvis Complete  12/06/2011  *RADIOLOGY REPORT*  Clinical Data: Right lower quadrant pain   TRANSABDOMINAL AND TRANSVAGINAL ULTRASOUND OF PELVIS Technique:  Both transabdominal and transvaginal ultrasound examinations of the pelvis were performed. Transabdominal technique was performed for global imaging of the pelvis including uterus, ovaries, adnexal regions, and pelvic cul-de-sac.  It was necessary to proceed with endovaginal exam following the transabdominal exam to visualize the endometrium and adnexa.  Comparison:  12/06/2011 CT  Findings:  Uterus: Retroflexed with an exophytic fibroid anteriorly and posteriorly, measuring 1.2 cm and 1.1 cm respectively.  The uterus measures 5.5 x 3.7 x 4.4 cm.  Endometrium: Normal in thickness and appearance, measuring 3 mm.  Right ovary:  Normal appearance/no adnexal mass.  The ovary measures 3.5 x 2.5 x 2.5 cm.  Left ovary: Normal appearance/no adnexal mass.  The ovary measures 4.1 x 2.0 x 2.0 cm.  1.4 cm corpus luteal cyst.  Other findings: Small amount of free fluid within the cul-de-sac.  Color Doppler flow with arterial and venous waveforms  documented to both ovaries.  IMPRESSION: Left corpus luteal cyst and small amount of free fluid within the pelvis, likely physiologic.  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  Color Doppler flow with arterial and venous waveforms documented to both ovaries.   Original Report Authenticated By: Jearld Lesch, M.D.    [place] Art/ven Flow Abd Pelv Doppler  12/06/2011  *RADIOLOGY REPORT*  Clinical Data: Right lower quadrant pain  TRANSABDOMINAL AND TRANSVAGINAL ULTRASOUND OF PELVIS Technique:  Both transabdominal and transvaginal ultrasound examinations of the pelvis were performed. Transabdominal technique was performed for global imaging of the pelvis including uterus, ovaries, adnexal regions, and pelvic cul-de-sac.  It was necessary to proceed with endovaginal exam following the transabdominal exam to visualize the endometrium and adnexa.  Comparison:  12/06/2011 CT  Findings:  Uterus: Retroflexed with an  exophytic fibroid anteriorly and posteriorly, measuring 1.2 cm and 1.1 cm respectively.  The uterus measures 5.5 x 3.7 x 4.4 cm.  Endometrium: Normal in thickness and appearance, measuring 3 mm.  Right ovary:  Normal appearance/no adnexal mass.  The ovary measures 3.5 x 2.5 x 2.5 cm.  Left ovary: Normal appearance/no adnexal mass.  The ovary measures 4.1 x 2.0 x 2.0 cm.  1.4 cm corpus luteal cyst.  Other findings: Small amount of free fluid within the cul-de-sac.  Color Doppler flow with arterial and venous waveforms documented to both ovaries.  IMPRESSION: Left corpus luteal cyst and small amount of free fluid within the pelvis, likely physiologic.  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  Color Doppler flow with arterial and venous waveforms documented to both ovaries.   Original Report Authenticated By: Jearld Lesch, M.D.      No diagnosis found.  11:27 PM Explained test results to pt. Mother has arrived. She says pt has had recurrent episodes of this in past and has had gi eval including scope.   She wants pt to be seen by her pcp in [place].     MDM  Syncope        Cheri Guppy, MD 12/08/11 4540  Cheri Guppy, MD 12/08/11 2328

## 2011-12-08 NOTE — ED Provider Notes (Signed)
Medical screening examination/treatment/procedure(s) were conducted as a shared visit with non-physician practitioner(s) and myself.  I personally evaluated the patient during the encounter   The pt with tenderness in the RLQ. Evaluation with CT demonstrates no significant pathology. US obtained demonstrating cyst, however, opposite side of pain. Pain improved. Vitals normal. Dc home in good condition with pcp follow up.   Lyanne Co, MD 12/08/11 2056

## 2011-12-08 NOTE — ED Notes (Signed)
As per EMS, pt was at work and became very hot and  light headed, pt sts she is unaware that she passed out, then awoke sitting in a chair.pt is N/V.VSS

## 2011-12-08 NOTE — ED Notes (Signed)
EKG printed and given to EDP Caporossi for review. No previous available for comparsion

## 2014-03-03 ENCOUNTER — Encounter: Payer: Self-pay | Admitting: Physician Assistant

## 2014-03-09 ENCOUNTER — Encounter: Payer: Self-pay | Admitting: *Deleted

## 2014-03-10 ENCOUNTER — Other Ambulatory Visit (INDEPENDENT_AMBULATORY_CARE_PROVIDER_SITE_OTHER): Payer: Managed Care, Other (non HMO)

## 2014-03-10 ENCOUNTER — Encounter: Payer: Self-pay | Admitting: Physician Assistant

## 2014-03-10 ENCOUNTER — Ambulatory Visit (INDEPENDENT_AMBULATORY_CARE_PROVIDER_SITE_OTHER): Payer: Managed Care, Other (non HMO) | Admitting: Physician Assistant

## 2014-03-10 VITALS — BP 102/68 | HR 84 | Ht 61.0 in | Wt 153.2 lb

## 2014-03-10 DIAGNOSIS — K589 Irritable bowel syndrome without diarrhea: Secondary | ICD-10-CM

## 2014-03-10 DIAGNOSIS — R112 Nausea with vomiting, unspecified: Secondary | ICD-10-CM

## 2014-03-10 DIAGNOSIS — K920 Hematemesis: Secondary | ICD-10-CM

## 2014-03-10 DIAGNOSIS — R1013 Epigastric pain: Secondary | ICD-10-CM

## 2014-03-10 LAB — CBC WITH DIFFERENTIAL/PLATELET
BASOS ABS: 0 10*3/uL (ref 0.0–0.1)
Basophils Relative: 0.6 % (ref 0.0–3.0)
Eosinophils Absolute: 0.1 10*3/uL (ref 0.0–0.7)
Eosinophils Relative: 1.5 % (ref 0.0–5.0)
HCT: 36.6 % (ref 36.0–46.0)
Hemoglobin: 12.5 g/dL (ref 12.0–15.0)
LYMPHS PCT: 37 % (ref 12.0–46.0)
Lymphs Abs: 2.1 10*3/uL (ref 0.7–4.0)
MCHC: 34.1 g/dL (ref 30.0–36.0)
MCV: 90.6 fl (ref 78.0–100.0)
Monocytes Absolute: 0.3 10*3/uL (ref 0.1–1.0)
Monocytes Relative: 5.3 % (ref 3.0–12.0)
NEUTROS ABS: 3.2 10*3/uL (ref 1.4–7.7)
NEUTROS PCT: 55.6 % (ref 43.0–77.0)
Platelets: 348 10*3/uL (ref 150.0–400.0)
RBC: 4.04 Mil/uL (ref 3.87–5.11)
RDW: 12.6 % (ref 11.5–15.5)
WBC: 5.7 10*3/uL (ref 4.0–10.5)

## 2014-03-10 LAB — COMPREHENSIVE METABOLIC PANEL
ALT: 30 U/L (ref 0–35)
AST: 23 U/L (ref 0–37)
Albumin: 4.4 g/dL (ref 3.5–5.2)
Alkaline Phosphatase: 56 U/L (ref 39–117)
BUN: 12 mg/dL (ref 6–23)
CALCIUM: 9.4 mg/dL (ref 8.4–10.5)
CHLORIDE: 104 meq/L (ref 96–112)
CO2: 27 mEq/L (ref 19–32)
Creatinine, Ser: 0.74 mg/dL (ref 0.40–1.20)
GFR: 125.78 mL/min (ref >60.00)
Glucose, Bld: 91 mg/dL (ref 70–99)
POTASSIUM: 3.9 meq/L (ref 3.5–5.1)
Sodium: 136 mEq/L (ref 135–145)
Total Bilirubin: 0.7 mg/dL (ref 0.2–1.2)
Total Protein: 8.3 g/dL (ref 6.0–8.3)

## 2014-03-10 LAB — LIPASE: Lipase: 24 U/L (ref 11.0–59.0)

## 2014-03-10 LAB — AMYLASE: Amylase: 73 U/L (ref 27–131)

## 2014-03-10 LAB — IGA: IGA: 189 mg/dL (ref 68–378)

## 2014-03-10 MED ORDER — PANTOPRAZOLE SODIUM 40 MG PO TBEC: 40.0000 mg | DELAYED_RELEASE_TABLET | Freq: Every day | ORAL | Status: DC

## 2014-03-10 NOTE — Progress Notes (Signed)
Patient ID: Miranda Adams, female   DOB: May 17, 1990, 24 y.o.   MRN: [id number]    HPI:   Miranda Adams is a 24 year old female self referred for evalaution of epigastric pain, nausea, and vomiting.    Pt says she has had nausea for one month.Nausea is constant and not affected by food. She vomits daily, mostly in the morning, sometimes in the afternoon. Vomitus is yellow bile.Has mild epigastric discomfort. No heartburn. No dysphagia or odynophagia. Since onset, has had days of formed stools alternating with mushy stools. No BRBPR or melena. No tenesmus or mucus. Has city water. No recent travel outside of [place]. No new pets. 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 She will move to [place] in 6 months when her lease is done. She was seen by a GI in [place] in 2011 fpr similar symptoms and had an EGD that was normal. Her symptoms abated when she finished finals and graduated. She was seen at [facility] on Feb 13 because she had vomited and saw some bloody streaks in her vomitus.She was prescribed phenergan and advised to see GI. She went to [facility] in [place] last week and was given phenergan. She says she asked for something for stress as she feels stress is causing her symptoms, and was told to getr a PCP. She has an appt with a PCP at [facility] tomorrow to discuss anxiety. She has no heartburn but has been belching a lot.  Past Medical History  Diagnosis Date  . Anxiety     Past Surgical History  Procedure Laterality Date  . None     Family History  Problem Relation Age of Onset  . Hypertension Mother   . Psychiatric Illness Mother   . Thyroid disease Mother   . Breast cancer Maternal Grandmother   . Cancer Paternal Grandmother     unknown type  . Colon cancer Neg Hx   . Esophageal cancer Neg Hx    History  Substance Use Topics  . Smoking status: Current Some Day Smoker  . Smokeless tobacco: Never Used     Comment: form  given 03-10-14  . Alcohol Use: 0.0 oz/week    0 Standard drinks or equivalent per week     Comment: Rarely   Current Outpatient Prescriptions  Medication Sig Dispense Refill  . Norgestimate-Ethinyl Estradiol Triphasic (TRI-SPRINTEC) 0.18/0.215/0.25 MG-35 MCG tablet Take 1 tablet by mouth daily.    . Promethazine HCl (PHENERGAN PO) Take by mouth as needed.    . pantoprazole (PROTONIX) 40 MG tablet Take 1 tablet (40 mg total) by mouth daily before breakfast. 30 tablet 2   No current facility-administered medications for this visit.   Allergies  Allergen Reactions  . Iodine Anaphylaxis  . Shellfish Allergy Anaphylaxis     Review of Systems: Gen: Denies any fever, chills, sweats, anorexia, fatigue, weakness, malaise, weight loss, and sleep disorder CV: Denies chest pain, angina, palpitations, syncope, orthopnea, PND, peripheral edema, and claudication. Resp: Denies dyspnea at rest, dyspnea with exercise, cough, sputum, wheezing, coughing up blood, and pleurisy. GI: Denies vomiting  jaundice, and fecal incontinence.   Denies dysphagia or odynophagia. GU : Denies urinary burning, blood in urine, urinary frequency, urinary hesitancy, nocturnal urination, and urinary incontinence. MS: Denies joint pain, limitation of movement, and swelling, stiffness, low back pain, extremity pain. Denies muscle weakness, cramps, atrophy.  Derm: Denies rash, itching, dry skin, hives, moles, warts, or unhealing ulcers.  Psych: Denies depression, anxiety, memory loss, suicidal ideation, hallucinations, paranoia, and confusion. Heme: Denies bruising, bleeding, and enlarged lymph nodes. Neuro:  Denies any headaches, dizziness, paresthesias. Endo:  Denies any problems with DM, thyroid, adrenal function   Physical Exam: BP 102/68 mmHg  Pulse 84  Ht 5\' 1"  (1.549 m)  Wt 153 lb 3.2 oz (69.491 kg)  BMI 28.96 kg/m2 Constitutional: Pleasant,well-developed, .female in no acute distress. HEENT: Normocephalic and  atraumatic. Conjunctivae are normal. No scleral icterus. Neck supple. No thyromegaly Cardiovascular: Normal rate, regular rhythm.  Pulmonary/chest: Effort normal and breath sounds normal. No wheezing, rales or rhonchi. Abdominal: Soft, nondistended, mild epigastric tenderness, Bowel sounds active throughout. There are no masses palpable. No hepatomegaly. Extremities: no edema Lymphadenopathy: No cervical adenopathy noted. Neurological: Alert and oriented to person place and time. Skin: Skin is warm and dry. No rashes noted. Psychiatric: Normal mood and affect. Behavior is normal.  ASSESSMENT AND PLAN: 24 year old female with a month hx of epigastric pain, nausea, and vomiting (once with bloody streaks),and erratic bowel movements. Pts symptoms may be due to IBS/stress, as well as gastritis or ulcer. She will be given a trial of pantoprazole 40 mg q am, along with an antireflux regimen.  A  CBC, CMET, amylase, lipase, IgA, TTG, stool for occult blood, and GI pathogen panel will be obtained. Pt has signed a medical release to obtain records from [facility] practice she was seen at in past, as well as from [facility] and [facility]. She will be scheduled for an EGD to evaluate for esophagitis, gastritis, ulcer, etc. The risks, benefits, and alternatives to endoscopy with possible biopsy and possible dilation were discussed with the patient and they consent to proceed. Further recommendations will be made pending the findings of the above.    Daniyla Pfahler, Vita Barley PA-C 03/10/2014, 9:28 PM

## 2014-03-10 NOTE — Patient Instructions (Addendum)
You have been given a separate informational sheet regarding your tobacco use, the importance of quitting and local resources to help you quit.  You have been scheduled for an endoscopy. Please follow written instructions given to you at your visit today. If you use inhalers (even only as needed), please bring them with you on the day of your procedure. Your physician has requested that you go to www.startemmi.com and enter the access code given to you at your visit today. This web site gives a general overview about your procedure. However, you should still follow specific instructions given to you by our office regarding your preparation for the procedure.  Your physician has requested that you go to the basement for the following lab work before leaving today: CBC, CMET, Amylase, Lipase, IgA, Tissue Transglutamase, GI pathogen panel  We have given you stool cards to take home and complete. Once these are completed, please mail back to our lab via [place].  Please purchase the following medications over the counter and take as directed: Benefiber-1 heaping tablesppon daily  We have sent the following medications to your pharmacy for you to pick up at your convenience: Pantoprazole 40 mg every morning (30 minutes before breakfast)  Please follow antireflux measures given to you today.  We will try to get records from [facility] in [place] and records for [facility] doctor.  We have given you a work note today.

## 2014-03-11 LAB — TISSUE TRANSGLUTAMINASE, IGA: TISSUE TRANSGLUTAMINASE AB, IGA: 1 U/mL (ref <4)

## 2014-03-11 NOTE — Progress Notes (Signed)
i agree with the above note, plan 

## 2014-03-12 ENCOUNTER — Ambulatory Visit (AMBULATORY_SURGERY_CENTER): Payer: Managed Care, Other (non HMO) | Admitting: Gastroenterology

## 2014-03-12 ENCOUNTER — Encounter: Payer: Self-pay | Admitting: Gastroenterology

## 2014-03-12 VITALS — BP 111/59 | HR 83 | Temp 98.5°F | Resp 21 | Ht 61.0 in | Wt 153.0 lb

## 2014-03-12 DIAGNOSIS — K2990 Gastroduodenitis, unspecified, without bleeding: Secondary | ICD-10-CM

## 2014-03-12 DIAGNOSIS — K2970 Gastritis, unspecified, without bleeding: Secondary | ICD-10-CM

## 2014-03-12 DIAGNOSIS — K2950 Unspecified chronic gastritis without bleeding: Secondary | ICD-10-CM

## 2014-03-12 DIAGNOSIS — R1013 Epigastric pain: Secondary | ICD-10-CM

## 2014-03-12 MED ORDER — SODIUM CHLORIDE 0.9 % IV SOLN: 500.0000 mL | INTRAVENOUS | Status: DC

## 2014-03-12 NOTE — Progress Notes (Signed)
Procedure ends, to recovery, report given and VSS. 

## 2014-03-12 NOTE — Op Note (Signed)
[facility]  [address] [place], [id number]   ENDOSCOPY PROCEDURE REPORT  PATIENT: Miranda, Adams  MR#: [id number] BIRTHDATE: 1990-09-27 , 23  yrs. old GENDER: female ENDOSCOPIST: Milus Banister, MD PROCEDURE DATE:  03/12/2014 PROCEDURE:  EGD w/ biopsy ASA CLASS:     Class I INDICATIONS:  nausea, dyspepsia. MEDICATIONS: Monitored anesthesia care and Propofol 230 mg IV TOPICAL ANESTHETIC: none  DESCRIPTION OF PROCEDURE: After the risks benefits and alternatives of the procedure were thoroughly explained, informed consent was obtained.  The LB [id number] endoscope was introduced through the mouth and advanced to the second portion of the duodenum , Without limitations.  The instrument was slowly withdrawn as the mucosa was fully examined.  There was mild, non-specific distal gastritis.  This was biopsied and sent to pathology.  The examination was otherwise normal. Retroflexed views revealed no abnormalities.     The scope was then withdrawn from the patient and the procedure completed.  COMPLICATIONS: There were no immediate complications.  ENDOSCOPIC IMPRESSION: There was mild, non-specific distal gastritis.  This was biopsied and sent to pathology.  The examination was otherwise normal  RECOMMENDATIONS: If biopsies show H.  pylori, you will be started on appropriate antibiotics. Continue to work on stress relief since it seems that this is helping and that stress is playing a prominant role in your GI symptoms.  eSigned:  Milus Banister, MD 03/12/2014 10:46 AM

## 2014-03-12 NOTE — Progress Notes (Signed)
Called to room to assist during endoscopic procedure.  Patient ID and intended procedure confirmed with present staff. Received instructions for my participation in the procedure from the performing physician.  

## 2014-03-12 NOTE — Patient Instructions (Signed)

## 2014-03-15 ENCOUNTER — Telehealth: Payer: Self-pay | Admitting: *Deleted

## 2014-03-15 NOTE — Telephone Encounter (Signed)
  Follow up Call-  Call back number 03/12/2014  Post procedure Call Back phone  # [phone]  Permission to leave phone message Yes     Patient questions:  Message left to call us if necessary.

## 2014-03-18 ENCOUNTER — Encounter: Payer: Self-pay | Admitting: Gastroenterology

## 2015-08-12 ENCOUNTER — Encounter (HOSPITAL_COMMUNITY): Payer: Self-pay | Admitting: *Deleted

## 2015-08-12 ENCOUNTER — Observation Stay (EMERGENCY_DEPARTMENT_HOSPITAL)
Admission: AD | Admit: 2015-08-12 | Discharge: 2015-08-13 | Disposition: A | Discharge status: Home / Self Care | Payer: Managed Care, Other (non HMO) | Source: Ambulatory Visit | Attending: Obstetrics and Gynecology | Admitting: Obstetrics and Gynecology

## 2015-08-12 ENCOUNTER — Inpatient Hospital Stay (HOSPITAL_COMMUNITY): Payer: Managed Care, Other (non HMO)

## 2015-08-12 DIAGNOSIS — O1403 Mild to moderate pre-eclampsia, third trimester: Secondary | ICD-10-CM | POA: Insufficient documentation

## 2015-08-12 DIAGNOSIS — Z3A35 35 weeks gestation of pregnancy: Secondary | ICD-10-CM

## 2015-08-12 DIAGNOSIS — O1493 Unspecified pre-eclampsia, third trimester: Secondary | ICD-10-CM | POA: Diagnosis not present

## 2015-08-12 DIAGNOSIS — R030 Elevated blood-pressure reading, without diagnosis of hypertension: Secondary | ICD-10-CM

## 2015-08-12 DIAGNOSIS — O99333 Smoking (tobacco) complicating pregnancy, third trimester: Secondary | ICD-10-CM | POA: Insufficient documentation

## 2015-08-12 DIAGNOSIS — O1404 Mild to moderate pre-eclampsia, complicating childbirth: Secondary | ICD-10-CM | POA: Diagnosis not present

## 2015-08-12 DIAGNOSIS — IMO0001 Reserved for inherently not codable concepts without codable children: Secondary | ICD-10-CM

## 2015-08-12 DIAGNOSIS — O1490 Unspecified pre-eclampsia, unspecified trimester: Secondary | ICD-10-CM | POA: Diagnosis present

## 2015-08-12 LAB — COMPREHENSIVE METABOLIC PANEL
ALT: 10 U/L — AB (ref 14–54)
AST: 17 U/L (ref 15–41)
Albumin: 2.8 g/dL — ABNORMAL LOW (ref 3.5–5.0)
Alkaline Phosphatase: 215 U/L — ABNORMAL HIGH (ref 38–126)
Anion gap: 8 (ref 5–15)
BILIRUBIN TOTAL: 0.6 mg/dL (ref 0.3–1.2)
BUN: 6 mg/dL (ref 6–20)
CHLORIDE: 102 mmol/L (ref 101–111)
CO2: 22 mmol/L (ref 22–32)
CREATININE: 0.6 mg/dL (ref 0.44–1.00)
Calcium: 8.5 mg/dL — ABNORMAL LOW (ref 8.9–10.3)
Glucose, Bld: 81 mg/dL (ref 65–99)
POTASSIUM: 3.9 mmol/L (ref 3.5–5.1)
Sodium: 132 mmol/L — ABNORMAL LOW (ref 135–145)
TOTAL PROTEIN: 6.7 g/dL (ref 6.5–8.1)

## 2015-08-12 LAB — CBC
HEMATOCRIT: 27.1 % — AB (ref 36.0–46.0)
Hemoglobin: 9.3 g/dL — ABNORMAL LOW (ref 12.0–15.0)
MCH: 30.4 pg (ref 26.0–34.0)
MCHC: 34.3 g/dL (ref 30.0–36.0)
MCV: 88.6 fL (ref 78.0–100.0)
PLATELETS: 254 10*3/uL (ref 150–400)
RBC: 3.06 MIL/uL — ABNORMAL LOW (ref 3.87–5.11)
RDW: 12.2 % (ref 11.5–15.5)
WBC: 8.4 10*3/uL (ref 4.0–10.5)

## 2015-08-12 LAB — PROTEIN / CREATININE RATIO, URINE
CREATININE, URINE: 153 mg/dL
Protein Creatinine Ratio: 0.5 mg/mg{Cre} — ABNORMAL HIGH (ref 0.00–0.15)
TOTAL PROTEIN, URINE: 76 mg/dL

## 2015-08-12 MED ORDER — NIFEDIPINE ER OSMOTIC RELEASE 30 MG PO TB24
30.0000 mg | ORAL_TABLET | Freq: Once | ORAL | Status: AC
  Administered 2015-08-12: 30 mg via ORAL
  Filled 2015-08-12: qty 1

## 2015-08-12 MED ORDER — ZOLPIDEM TARTRATE 5 MG PO TABS: 5.0000 mg | ORAL_TABLET | Freq: Every evening | ORAL | Status: DC | PRN

## 2015-08-12 MED ORDER — CALCIUM CARBONATE ANTACID 500 MG PO CHEW: 2.0000 | CHEWABLE_TABLET | ORAL | Status: DC | PRN

## 2015-08-12 MED ORDER — ACETAMINOPHEN 325 MG PO TABS: 650.0000 mg | ORAL_TABLET | ORAL | Status: DC | PRN

## 2015-08-12 MED ORDER — DOCUSATE SODIUM 100 MG PO CAPS: 100.0000 mg | ORAL_CAPSULE | Freq: Every day | ORAL | Status: DC

## 2015-08-12 MED ORDER — BETAMETHASONE SOD PHOS & ACET 6 (3-3) MG/ML IJ SUSP
12.0000 mg | INTRAMUSCULAR | Status: DC
  Administered 2015-08-12: 12 mg via INTRAMUSCULAR
  Filled 2015-08-12: qty 2

## 2015-08-12 MED ORDER — PRENATAL MULTIVITAMIN CH: 1.0000 | ORAL_TABLET | Freq: Every day | ORAL | Status: DC

## 2015-08-12 MED ORDER — PRENATAL MULTIVITAMIN CH
1.0000 | ORAL_TABLET | Freq: Every day | ORAL | Status: DC
  Administered 2015-08-13: 1 via ORAL
  Filled 2015-08-12: qty 1

## 2015-08-12 MED ORDER — NIFEDIPINE ER OSMOTIC RELEASE 30 MG PO TB24
30.0000 mg | ORAL_TABLET | Freq: Every day | ORAL | Status: DC
  Administered 2015-08-13: 30 mg via ORAL
  Filled 2015-08-12: qty 1

## 2015-08-12 MED ORDER — DOCUSATE SODIUM 100 MG PO CAPS
100.0000 mg | ORAL_CAPSULE | Freq: Every day | ORAL | Status: DC
  Administered 2015-08-13: 100 mg via ORAL
  Filled 2015-08-12: qty 1

## 2015-08-12 MED ORDER — ACETAMINOPHEN 325 MG PO TABS
650.0000 mg | ORAL_TABLET | Freq: Once | ORAL | Status: AC
  Administered 2015-08-12: 650 mg via ORAL
  Filled 2015-08-12: qty 2

## 2015-08-12 NOTE — MAU Note (Signed)
Urine sent to lab 

## 2015-08-12 NOTE — MAU Provider Note (Signed)
History     CSN: [id number]  Arrival date and time: 08/12/15 1348   First Provider Initiated Contact with Patient 08/12/15 1454      Chief Complaint  Patient presents with  . Hypertension  . Headache   HPI Miranda Adams is a 26 y.o. G1P0 at [redacted]w[redacted]d who presents from the office for BP evaluation. Reports elevated BP for the last few visits, previously with normal labs. Today had protein urine & increase lower extremity swelling. Reports headache that started after leaving the OB office today. Rates pain 2/10. Has not treated. States it is mild aching. Denies vision changes, epigastric pain, n/v, CP, SOB, ctx, Vaginal bleeding, LOF. Positive fetal movement.   OB History    Gravida Para Term Preterm AB Living   1             SAB TAB Ectopic Multiple Live Births                  Past Medical History:  Diagnosis Date  . Anxiety     Past Surgical History:  Procedure Laterality Date  . none      Family History  Problem Relation Age of Onset  . Hypertension Mother   . Psychiatric Illness Mother   . Thyroid disease Mother   . Breast cancer Maternal Grandmother   . Cancer Paternal Grandmother     unknown type  . Colon cancer Neg Hx   . Esophageal cancer Neg Hx     Social History  Substance Use Topics  . Smoking status: Current Some Day Smoker  . Smokeless tobacco: Never Used     Comment: form given 03-10-14  . Alcohol use 0.0 oz/week     Comment: Rarely    Allergies:  Allergies  Allergen Reactions  . Iodine Anaphylaxis  . Shellfish Allergy Anaphylaxis    Prescriptions Prior to Admission  Medication Sig Dispense Refill Last Dose  . ferrous sulfate 325 (65 FE) MG tablet Take 325 mg by mouth daily with breakfast.   Past Week at Unknown time  . Prenatal Vit-Fe Fumarate-FA (PRENATAL MULTIVITAMIN) TABS tablet Take 1 tablet by mouth daily at 12 noon.   Past Week at Unknown time    Review of Systems  Constitutional: Negative.   Eyes: Negative for blurred vision.   Respiratory: Negative.   Cardiovascular: Negative.   Gastrointestinal: Negative.   Genitourinary: Negative.   Neurological: Positive for headaches. Negative for dizziness.   Physical Exam   Blood pressure 146/100, pulse 96, temperature 98.2 F (36.8 C), temperature source Oral, resp. rate 18. Patient Vitals for the past 24 hrs:  BP Temp Temp src Pulse Resp Height Weight  08/12/15 1726 (!) 154/93 98.5 F (36.9 C) Oral 78 16 5\' 1"  (1.549 m) 187 lb (84.8 kg)  08/12/15 1701 152/99 - - 85 - - -  08/12/15 1552 142/98 - - 90 - - -  08/12/15 1530 145/98 - - 86 - - -  08/12/15 1520 141/94 - - 86 - - -  08/12/15 1510 139/100 - - 89 - - -  08/12/15 1500 141/97 - - 89 - - -  08/12/15 1450 (!) 149/101 - - 97 - - -  08/12/15 1441 146/100 - - 96 - - -  08/12/15 1431 (!) 154/103 - - 118 - - -  08/12/15 1427 (!) 149/103 - - 99 - - -  08/12/15 1404 (!) 152/101 98.2 F (36.8 C) Oral 95 18 - -    Physical Exam  Nursing note and vitals reviewed. Constitutional: She is oriented to person, place, and time. She appears well-developed and well-nourished. No distress.  HENT:  Head: Normocephalic and atraumatic.  Eyes: Conjunctivae are normal. Right eye exhibits no discharge. Left eye exhibits no discharge. No scleral icterus.  Neck: Normal range of motion.  Cardiovascular: Normal rate, regular rhythm and normal heart sounds.   No murmur heard. Respiratory: Effort normal and breath sounds normal. No respiratory distress. She has no wheezes.  GI: Soft. There is no tenderness.  Musculoskeletal: She exhibits edema (BLE, 2+).  Neurological: She is alert and oriented to person, place, and time. She has normal reflexes.  No clonus  Skin: Skin is warm and dry. She is not diaphoretic.  Psychiatric: She has a normal mood and affect. Her behavior is normal. Judgment and thought content normal.   Fetal Tracing:  Baseline: 145 Variability: moderate Accelerations: 15x15 Decelerations: none  Toco:  UI   MAU Course  Procedures Results for orders placed or performed during the hospital encounter of 08/12/15 (from the past 24 hour(s))  Protein / creatinine ratio, urine     Status: Abnormal   Collection Time: 08/12/15  2:05 PM  Result Value Ref Range   Creatinine, Urine 153.00 mg/dL   Total Protein, Urine 76 mg/dL   Protein Creatinine Ratio 0.50 (H) 0.00 - 0.15 mg/mg[Cre]  CBC     Status: Abnormal   Collection Time: 08/12/15  2:47 PM  Result Value Ref Range   WBC 8.4 4.0 - 10.5 K/uL   RBC 3.06 (L) 3.87 - 5.11 MIL/uL   Hemoglobin 9.3 (L) 12.0 - 15.0 g/dL   HCT 27.1 (L) 36.0 - 46.0 %   MCV 88.6 78.0 - 100.0 fL   MCH 30.4 26.0 - 34.0 pg   MCHC 34.3 30.0 - 36.0 g/dL   RDW 12.2 11.5 - 15.5 %   Platelets 254 150 - 400 K/uL  Comprehensive metabolic panel     Status: Abnormal   Collection Time: 08/12/15  2:47 PM  Result Value Ref Range   Sodium 132 (L) 135 - 145 mmol/L   Potassium 3.9 3.5 - 5.1 mmol/L   Chloride 102 101 - 111 mmol/L   CO2 22 22 - 32 mmol/L   Glucose, Bld 81 65 - 99 mg/dL   BUN 6 6 - 20 mg/dL   Creatinine, Ser 0.60 0.44 - 1.00 mg/dL   Calcium 8.5 (L) 8.9 - 10.3 mg/dL   Total Protein 6.7 6.5 - 8.1 g/dL   Albumin 2.8 (L) 3.5 - 5.0 g/dL   AST 17 15 - 41 U/L   ALT 10 (L) 14 - 54 U/L   Alkaline Phosphatase 215 (H) 38 - 126 U/L   Total Bilirubin 0.6 0.3 - 1.2 mg/dL   GFR calc non Af Amer >60 >60 mL/min   GFR calc Af Amer >60 >60 mL/min   Anion gap 8 5 - 15    MDM RN received initial orders from Dr. Charlesetta Garibaldi; CBC, CMP, urine PCR, BPP, tylenol, & procardia BPP 8/8, normal AFI No severe range BP, although BP remain elevated Headache resolved with tylenol Elevated urine PCR S/w Dr. Charlesetta Garibaldi regarding BPs & labs. Will admit to ante for obs.   Assessment and Plan  A:  1. Hypertension in pregnancy, preeclampsia, third trimester   2. Elevated blood pressure   3. Pre-eclampsia during pregnancy in third trimester, antepartum     P; Admit for observation on  antenatal unit Routine orders placed Dr. Charlesetta Garibaldi to  be called by ante RN for additional orders Pt notified & agreeable to plan  [name] 08/12/2015, 2:54 PM

## 2015-08-12 NOTE — MAU Note (Signed)
Report called to Antenatal. Pt transferred via wheelchair.

## 2015-08-12 NOTE — MAU Note (Signed)
130/91 today, +protein in urine.  Sent in for further eval.  Slight HA, just started.  Denies visual changes or epigastric pain.  Increase in swelling reported

## 2015-08-13 ENCOUNTER — Observation Stay (HOSPITAL_COMMUNITY): Payer: Managed Care, Other (non HMO)

## 2015-08-13 ENCOUNTER — Encounter (HOSPITAL_COMMUNITY): Payer: Self-pay | Admitting: *Deleted

## 2015-08-13 ENCOUNTER — Inpatient Hospital Stay (HOSPITAL_COMMUNITY)
Admission: AD | Admit: 2015-08-13 | Discharge: 2015-08-26 | DRG: 765 | Disposition: A | Discharge status: Home / Self Care | Payer: Managed Care, Other (non HMO) | Source: Ambulatory Visit | Attending: Obstetrics and Gynecology | Admitting: Obstetrics and Gynecology

## 2015-08-13 DIAGNOSIS — O1493 Unspecified pre-eclampsia, third trimester: Secondary | ICD-10-CM | POA: Diagnosis present

## 2015-08-13 DIAGNOSIS — O134 Gestational [pregnancy-induced] hypertension without significant proteinuria, complicating childbirth: Secondary | ICD-10-CM | POA: Diagnosis present

## 2015-08-13 DIAGNOSIS — D259 Leiomyoma of uterus, unspecified: Secondary | ICD-10-CM | POA: Diagnosis present

## 2015-08-13 DIAGNOSIS — F17200 Nicotine dependence, unspecified, uncomplicated: Secondary | ICD-10-CM | POA: Diagnosis present

## 2015-08-13 DIAGNOSIS — D62 Acute posthemorrhagic anemia: Secondary | ICD-10-CM | POA: Diagnosis not present

## 2015-08-13 DIAGNOSIS — Z3A37 37 weeks gestation of pregnancy: Secondary | ICD-10-CM | POA: Diagnosis not present

## 2015-08-13 DIAGNOSIS — O3413 Maternal care for benign tumor of corpus uteri, third trimester: Secondary | ICD-10-CM | POA: Diagnosis present

## 2015-08-13 DIAGNOSIS — Z8249 Family history of ischemic heart disease and other diseases of the circulatory system: Secondary | ICD-10-CM | POA: Diagnosis not present

## 2015-08-13 DIAGNOSIS — O9902 Anemia complicating childbirth: Secondary | ICD-10-CM | POA: Diagnosis not present

## 2015-08-13 DIAGNOSIS — O99334 Smoking (tobacco) complicating childbirth: Secondary | ICD-10-CM | POA: Diagnosis present

## 2015-08-13 DIAGNOSIS — O1404 Mild to moderate pre-eclampsia, complicating childbirth: Principal | ICD-10-CM | POA: Diagnosis present

## 2015-08-13 DIAGNOSIS — Z98891 History of uterine scar from previous surgery: Secondary | ICD-10-CM

## 2015-08-13 LAB — COMPREHENSIVE METABOLIC PANEL
ALBUMIN: 3 g/dL — AB (ref 3.5–5.0)
ALK PHOS: 230 U/L — AB (ref 38–126)
ALT: 11 U/L — AB (ref 14–54)
ANION GAP: 9 (ref 5–15)
AST: 19 U/L (ref 15–41)
BUN: 8 mg/dL (ref 6–20)
CALCIUM: 8.8 mg/dL — AB (ref 8.9–10.3)
CO2: 21 mmol/L — AB (ref 22–32)
Chloride: 104 mmol/L (ref 101–111)
Creatinine, Ser: 0.54 mg/dL (ref 0.44–1.00)
GFR calc Af Amer: >60 mL/min (ref >60)
GFR calc non Af Amer: >60 mL/min (ref >60)
GLUCOSE: 92 mg/dL (ref 65–99)
Potassium: 4 mmol/L (ref 3.5–5.1)
SODIUM: 134 mmol/L — AB (ref 135–145)
Total Bilirubin: 0.7 mg/dL (ref 0.3–1.2)
Total Protein: 7.1 g/dL (ref 6.5–8.1)

## 2015-08-13 LAB — TYPE AND SCREEN
ABO/RH(D): B POS
Antibody Screen: NEGATIVE

## 2015-08-13 LAB — CBC
HCT: 30.7 % — ABNORMAL LOW (ref 36.0–46.0)
HEMOGLOBIN: 10.6 g/dL — AB (ref 12.0–15.0)
MCH: 30.2 pg (ref 26.0–34.0)
MCHC: 34.5 g/dL (ref 30.0–36.0)
MCV: 87.5 fL (ref 78.0–100.0)
Platelets: 282 10*3/uL (ref 150–400)
RBC: 3.51 MIL/uL — ABNORMAL LOW (ref 3.87–5.11)
RDW: 12.1 % (ref 11.5–15.5)
WBC: 13.2 10*3/uL — ABNORMAL HIGH (ref 4.0–10.5)

## 2015-08-13 LAB — ABO/RH: ABO/RH(D): B POS

## 2015-08-13 LAB — PROTEIN, URINE, 24 HOUR
COLLECTION INTERVAL-UPROT: 24 h
PROTEIN, 24H URINE: 604 mg/d — AB (ref 50–100)
PROTEIN, URINE: 71 mg/dL
Urine Total Volume-UPROT: 850 mL

## 2015-08-13 MED ORDER — BETAMETHASONE SOD PHOS & ACET 6 (3-3) MG/ML IJ SUSP
12.0000 mg | INTRAMUSCULAR | Status: AC
  Administered 2015-08-13: 12 mg via INTRAMUSCULAR
  Filled 2015-08-13: qty 2

## 2015-08-13 MED ORDER — DOCUSATE SODIUM 100 MG PO CAPS
100.0000 mg | ORAL_CAPSULE | Freq: Every day | ORAL | Status: DC
Start: 2015-08-13 — End: 2015-08-23
  Administered 2015-08-14 – 2015-08-17 (×4): 100 mg via ORAL
  Filled 2015-08-13 (×6): qty 1

## 2015-08-13 MED ORDER — SODIUM CHLORIDE 0.9 % IV SOLN: 250.0000 mL | INTRAVENOUS | Status: DC | PRN

## 2015-08-13 MED ORDER — BETAMETHASONE SOD PHOS & ACET 6 (3-3) MG/ML IJ SUSP: 12.0000 mg | INTRAMUSCULAR | Status: DC

## 2015-08-13 MED ORDER — ZOLPIDEM TARTRATE 5 MG PO TABS: 5.0000 mg | ORAL_TABLET | Freq: Every evening | ORAL | Status: DC | PRN

## 2015-08-13 MED ORDER — ACETAMINOPHEN 325 MG PO TABS
650.0000 mg | ORAL_TABLET | ORAL | Status: DC | PRN
  Administered 2015-08-13 – 2015-08-18 (×5): 650 mg via ORAL
  Filled 2015-08-13 (×4): qty 2

## 2015-08-13 MED ORDER — PRENATAL MULTIVITAMIN CH
1.0000 | ORAL_TABLET | Freq: Every day | ORAL | Status: DC
  Administered 2015-08-14 – 2015-08-21 (×8): 1 via ORAL
  Filled 2015-08-13 (×8): qty 1

## 2015-08-13 MED ORDER — CALCIUM CARBONATE ANTACID 500 MG PO CHEW
2.0000 | CHEWABLE_TABLET | ORAL | Status: DC | PRN
  Administered 2015-08-14: 400 mg via ORAL
  Filled 2015-08-13: qty 2

## 2015-08-13 MED ORDER — SODIUM CHLORIDE 0.9% FLUSH
3.0000 mL | Freq: Two times a day (BID) | INTRAVENOUS | Status: DC
  Administered 2015-08-13 – 2015-08-21 (×16): 3 mL via INTRAVENOUS

## 2015-08-13 MED ORDER — NIFEDIPINE ER 30 MG PO TB24: 30.0000 mg | ORAL_TABLET | Freq: Every day | ORAL | 1 refills | Status: DC

## 2015-08-13 MED ORDER — NIFEDIPINE ER OSMOTIC RELEASE 30 MG PO TB24
30.0000 mg | ORAL_TABLET | Freq: Every day | ORAL | Status: DC
  Administered 2015-08-14 – 2015-08-23 (×10): 30 mg via ORAL
  Filled 2015-08-13 (×10): qty 1

## 2015-08-13 MED ORDER — SODIUM CHLORIDE 0.9% FLUSH: 3.0000 mL | INTRAVENOUS | Status: DC | PRN

## 2015-08-13 NOTE — Progress Notes (Addendum)
Hospital day # 0 pregnancy at [redacted]w[redacted]d-pre-eclampsia without severe features. Readmitted s/p leaving hospital for baby shower today.  S:  Doing well, denies HA, visual sx, or epigastric pain.      Perception of contractions: none      Vaginal bleeding: None       Vaginal discharge:  None  O: Temp 98.8 F (37.1 C) (Oral)   Resp 18   BP 131/85.      Fetal tracings:      Contractions: None        Uterus gravid and non-tender      Extremities: no significant edema and no signs of DVT   [place] prior to d/[place]: Vtx, left lateral placenta, EFW 5+6, 32%ile, AFI 12.7, 41%ile.          Labs:   Results for orders placed or performed during the hospital encounter of 08/12/15 (from the past 48 hour(s))  Protein / creatinine ratio, urine     Status: Abnormal   Collection Time: 08/12/15  2:05 PM  Result Value Ref Range   Creatinine, Urine 153.00 mg/dL   Total Protein, Urine 76 mg/dL    Comment: NO NORMAL RANGE ESTABLISHED FOR THIS TEST   Protein Creatinine Ratio 0.50 (H) 0.00 - 0.15 mg/mg[Cre]  CBC     Status: Abnormal   Collection Time: 08/12/15  2:47 PM  Result Value Ref Range   WBC 8.4 4.0 - 10.5 K/uL   RBC 3.06 (L) 3.87 - 5.11 MIL/uL   Hemoglobin 9.3 (L) 12.0 - 15.0 g/dL   HCT 27.1 (L) 36.0 - 46.0 %   MCV 88.6 78.0 - 100.0 fL   MCH 30.4 26.0 - 34.0 pg   MCHC 34.3 30.0 - 36.0 g/dL   RDW 12.2 11.5 - 15.5 %   Platelets 254 150 - 400 K/uL  Comprehensive metabolic panel     Status: Abnormal   Collection Time: 08/12/15  2:47 PM  Result Value Ref Range   Sodium 132 (L) 135 - 145 mmol/L   Potassium 3.9 3.5 - 5.1 mmol/L   Chloride 102 101 - 111 mmol/L   CO2 22 22 - 32 mmol/L   Glucose, Bld 81 65 - 99 mg/dL   BUN 6 6 - 20 mg/dL   Creatinine, Ser 0.60 0.44 - 1.00 mg/dL   Calcium 8.5 (L) 8.9 - 10.3 mg/dL   Total Protein 6.7 6.5 - 8.1 g/dL   Albumin 2.8 (L) 3.5 - 5.0 g/dL   AST 17 15 - 41 U/L   ALT 10 (L) 14 - 54 U/L   Alkaline Phosphatase 215 (H) 38 - 126 U/L   Total Bilirubin 0.6 0.3 -  1.2 mg/dL   GFR calc non Af Amer >60 >60 mL/min   GFR calc Af Amer >60 >60 mL/min    Comment: (NOTE) The eGFR has been calculated using the CKD EPI equation. This calculation has not been validated in all clinical situations. eGFR's persistently <60 mL/min signify possible Chronic Kidney Disease.    Anion gap 8 5 - 15  Comprehensive metabolic panel     Status: Abnormal   Collection Time: 08/13/15  7:52 AM  Result Value Ref Range   Sodium 134 (L) 135 - 145 mmol/L   Potassium 4.0 3.5 - 5.1 mmol/L   Chloride 104 101 - 111 mmol/L   CO2 21 (L) 22 - 32 mmol/L   Glucose, Bld 92 65 - 99 mg/dL   BUN 8 6 - 20 mg/dL   Creatinine, Ser  0.54 0.44 - 1.00 mg/dL   Calcium 8.8 (L) 8.9 - 10.3 mg/dL   Total Protein 7.1 6.5 - 8.1 g/dL   Albumin 3.0 (L) 3.5 - 5.0 g/dL   AST 19 15 - 41 U/L   ALT 11 (L) 14 - 54 U/L   Alkaline Phosphatase 230 (H) 38 - 126 U/L   Total Bilirubin 0.7 0.3 - 1.2 mg/dL   GFR calc non Af Amer >60 >60 mL/min   GFR calc Af Amer >60 >60 mL/min    Comment: (NOTE) The eGFR has been calculated using the CKD EPI equation. This calculation has not been validated in all clinical situations. eGFR's persistently <60 mL/min signify possible Chronic Kidney Disease.    Anion gap 9 5 - 15  CBC on admission     Status: Abnormal   Collection Time: 08/13/15  7:52 AM  Result Value Ref Range   WBC 13.2 (H) 4.0 - 10.5 K/uL   RBC 3.51 (L) 3.87 - 5.11 MIL/uL   Hemoglobin 10.6 (L) 12.0 - 15.0 g/dL   HCT 30.7 (L) 36.0 - 46.0 %   MCV 87.5 78.0 - 100.0 fL   MCH 30.2 26.0 - 34.0 pg   MCHC 34.5 30.0 - 36.0 g/dL   RDW 12.1 11.5 - 15.5 %   Platelets 282 150 - 400 K/uL  Type and screen [place]     Status: None   Collection Time: 08/13/15  7:52 AM  Result Value Ref Range   ABO/RH(D) B POS    Antibody Screen NEG    Sample Expiration 08/16/2015   ABO/Rh     Status: None   Collection Time: 08/13/15  7:52 AM  Result Value Ref Range   ABO/RH(D) B POS          Meds:  .  betamethasone acetate-betamethasone sodium phosphate  12 mg Intramuscular Q24H  . docusate sodium  100 mg Oral Daily  . [START ON 08/14/2015] NIFEdipine  30 mg Oral Daily  . [START ON 08/14/2015] prenatal multivitamin  1 tablet Oral Q1200  . sodium chloride flush  3 mL Intravenous Q12H    A: 47w5dwith pre-eclampsia     Readmit from permitted LOA from hospital     Stable   P: Continue current plan of care      Upcoming tests/treatments:    Complete betamethasone course tonight  MFM consult for timing of delivery  24 hour urine for protein--completed now  NST TID  Procardia 30 mg XL daily             Saline lock    T&S still current from today        MDs will follow  LDonnel SaxonCNM, MN 08/13/2015 8:42 PM

## 2015-08-13 NOTE — H&P (Signed)
History     CSN: [id number]  Arrival date and time: 08/12/15 1348   First Provider Initiated Contact with Patient 08/12/15 1454         Chief Complaint  Patient presents with  . Hypertension  . Headache   HPI Miranda Adams is a 25 y.o. G1P0 at [redacted]w[redacted]d who presents from the office for BP evaluation. Reports elevated BP for the last few visits, previously with normal labs. Today had protein urine & increase lower extremity swelling. Reports headache that started after leaving the OB office today. Rates pain 2/10. Has not treated. States it is mild aching. Denies vision changes, epigastric pain, n/v, CP, SOB, ctx, Vaginal bleeding, LOF. Positive fetal movement.           OB History    Gravida Para Term Preterm AB Living   1             SAB TAB Ectopic Multiple Live Births                      Past Medical History:  Diagnosis Date  . Anxiety          Past Surgical History:  Procedure Laterality Date  . none            Family History  Problem Relation Age of Onset  . Hypertension Mother   . Psychiatric Illness Mother   . Thyroid disease Mother   . Breast cancer Maternal Grandmother   . Cancer Paternal Grandmother     unknown type  . Colon cancer Neg Hx   . Esophageal cancer Neg Hx            Social History   Substance Use Topics   . Smoking status: Current Some Day Smoker   . Smokeless tobacco: Never Used      Comment: form given 03-10-14   . Alcohol use 0.0 oz/week     Comment: Rarely     Allergies:      Allergies  Allergen Reactions  . Iodine Anaphylaxis  . Shellfish Allergy Anaphylaxis    Prescriptions Prior to Admission  Medication Sig Dispense Refill Last Dose  . ferrous sulfate 325 (65 FE) MG tablet Take 325 mg by mouth daily with breakfast.   Past Week at Unknown time  . Prenatal Vit-Fe Fumarate-FA (PRENATAL MULTIVITAMIN) TABS tablet Take 1 tablet by mouth daily at 12 noon.   Past Week at Unknown  time    Review of Systems  Constitutional: Negative.   Eyes: Negative for blurred vision.  Respiratory: Negative.   Cardiovascular: Negative.   Gastrointestinal: Negative.   Genitourinary: Negative.   Neurological: Positive for headaches. Negative for dizziness.   Physical Exam   Blood pressure 146/100, pulse 96, temperature 98.2 F (36.8 C), temperature source Oral, resp. rate 18. Patient Vitals for the past 24 hrs:  BP Temp Temp src Pulse Resp Height Weight  08/12/15 1726 (!) 154/93 98.5 F (36.9 C) Oral 78 16 5\' 1"  (1.549 m) 187 lb (84.8 kg)  08/12/15 1701 152/99 - - 85 - - -  08/12/15 1552 142/98 - - 90 - - -  08/12/15 1530 145/98 - - 86 - - -  08/12/15 1520 141/94 - - 86 - - -  08/12/15 1510 139/100 - - 89 - - -  08/12/15 1500 141/97 - - 89 - - -  08/12/15 1450 (!) 149/101 - - 97 - - -  08/12/15 1441 146/100 - - 96 - - -  08/12/15 1431 (!) 154/103 - - 118 - - -  08/12/15 1427 (!) 149/103 - - 99 - - -  08/12/15 1404 (!) 152/101 98.2 F (36.8 C) Oral 95 18 - -    Physical Exam  Nursing note and vitals reviewed. Constitutional: She is oriented to person, place, and time. She appears well-developed and well-nourished. No distress.  HENT:  Head: Normocephalic and atraumatic.  Eyes: Conjunctivae are normal. Right eye exhibits no discharge. Left eye exhibits no discharge. No scleral icterus.  Neck: Normal range of motion.  Cardiovascular: Normal rate, regular rhythm and normal heart sounds.   No murmur heard. Respiratory: Effort normal and breath sounds normal. No respiratory distress. She has no wheezes.  GI: Soft. There is no tenderness.  Musculoskeletal: She exhibits edema (BLE, 2+).  Neurological: She is alert and oriented to person, place, and time. She has normal reflexes.  No clonus  Skin: Skin is warm and dry. She is not diaphoretic.  Psychiatric: She has a normal mood and affect. Her behavior is normal. Judgment and thought content normal.   Fetal  Tracing:  Baseline: 145 Variability: moderate Accelerations: 15x15 Decelerations: none  Toco: UI   MAU Course  Procedures LabResultsLast24Hours       Results for orders placed or performed during the hospital encounter of 08/12/15 (from the past 24 hour(s))  Protein / creatinine ratio, urine     Status: Abnormal   Collection Time: 08/12/15  2:05 PM  Result Value Ref Range   Creatinine, Urine 153.00 mg/dL   Total Protein, Urine 76 mg/dL   Protein Creatinine Ratio 0.50 (H) 0.00 - 0.15 mg/mg[Cre]  CBC     Status: Abnormal   Collection Time: 08/12/15  2:47 PM  Result Value Ref Range   WBC 8.4 4.0 - 10.5 K/uL   RBC 3.06 (L) 3.87 - 5.11 MIL/uL   Hemoglobin 9.3 (L) 12.0 - 15.0 g/dL   HCT 27.1 (L) 36.0 - 46.0 %   MCV 88.6 78.0 - 100.0 fL   MCH 30.4 26.0 - 34.0 pg   MCHC 34.3 30.0 - 36.0 g/dL   RDW 12.2 11.5 - 15.5 %   Platelets 254 150 - 400 K/uL  Comprehensive metabolic panel     Status: Abnormal   Collection Time: 08/12/15  2:47 PM  Result Value Ref Range   Sodium 132 (L) 135 - 145 mmol/L   Potassium 3.9 3.5 - 5.1 mmol/L   Chloride 102 101 - 111 mmol/L   CO2 22 22 - 32 mmol/L   Glucose, Bld 81 65 - 99 mg/dL   BUN 6 6 - 20 mg/dL   Creatinine, Ser 0.60 0.44 - 1.00 mg/dL   Calcium 8.5 (L) 8.9 - 10.3 mg/dL   Total Protein 6.7 6.5 - 8.1 g/dL   Albumin 2.8 (L) 3.5 - 5.0 g/dL   AST 17 15 - 41 U/L   ALT 10 (L) 14 - 54 U/L   Alkaline Phosphatase 215 (H) 38 - 126 U/L   Total Bilirubin 0.6 0.3 - 1.2 mg/dL   GFR calc non Af Amer >60 >60 mL/min   GFR calc Af Amer >60 >60 mL/min   Anion gap 8 5 - 15      MDM RN received initial orders from Dr. Charlesetta Garibaldi; CBC, CMP, urine PCR, BPP, tylenol, & procardia BPP 8/8, normal AFI No severe range BP, although BP remain elevated Headache resolved with tylenol Elevated urine PCR S/w Dr. Charlesetta Garibaldi regarding BPs & labs. Will admit to ante for  obs.   Assessment and Plan  A:  1. Hypertension in  pregnancy, preeclampsia, third trimester   2. Elevated blood pressure   3. Pre-eclampsia during pregnancy in third trimester, antepartum     P; Admit for observation on antenatal unit Routine orders placed Dr. Charlesetta Garibaldi to be called by ante RN for additional orders Pt notified & agreeable to plan  Miranda Adams 08/12/2015, 2:54 PM

## 2015-08-13 NOTE — Discharge Summary (Signed)
Physician Discharge Summary  Patient ID: Miranda Adams MRN: [id number] DOB/AGE: 1990/03/19 25 y.o.  Admit date: 08/12/2015 Discharge date: 08/13/2015  Admission Diagnoses: mild preeclmpsia  Discharge Diagnoses:  Active Problems:   Hypertension in pregnancy, preeclampsia   Discharged Condition: good  Hospital Course: Pt was brought in for treatment of mild preeclampsia.  She would like to leave the hospital to attend her baby shower. Her and her family understand there is a small risk of seizure.  She will return this evening  Consults: None  Significant Diagnostic Studies: [place]  Treatments: steroids: BMZ  Discharge Exam: Blood pressure (!) 122/96, pulse (!) 104, temperature 98.2 F (36.8 C), temperature source Oral, resp. rate 19, height 5\' 1"  (1.549 m), weight 187 lb (84.8 kg). General appearance: alert and cooperative Resp: clear to auscultation bilaterally Cardio: regular rate and rhythm Extremities: Homans sign is negative, no sign of DVT  Disposition: 01-Home or Self Care  Discharge Instructions    Call MD for:  persistant dizziness or light-headedness    Complete by:  As directed   Call MD for:  severe uncontrolled pain    Complete by:  As directed   Call MD for:  temperature >100.4    Complete by:  As directed   Diet general    Complete by:  As directed   Discharge instructions    Complete by:  As directed   Call for vaginal bleeding if you soak greater than pad per hour   Increase activity slowly    Complete by:  As directed   May shower / Bathe    Complete by:  As directed   Sexual Activity Restrictions    Complete by:  As directed   Pelvic rest.  Nothing in the vagina       Medication List    TAKE these medications   ferrous sulfate 325 (65 FE) MG tablet Take 325 mg by mouth daily with breakfast.   NIFEdipine 30 MG 24 hr tablet Commonly known as:  PROCARDIA-XL/ADALAT CC Take 1 tablet (30 mg total) by mouth daily.   prenatal multivitamin Tabs  tablet Take 1 tablet by mouth daily at 12 noon.        SignedCrawford Givens A 08/13/2015, 11:52 AM

## 2015-08-13 NOTE — Progress Notes (Signed)
Patient educated on risks of seizures and understands that she needs to return by 6 pm for 2nd BMZ and 24 urine per Dr. Charlesetta Garibaldi.

## 2015-08-13 NOTE — H&P (Signed)
Ms Miranda Adams is 07-Mar-1990  No LMP recorded. Patient is pregnant. [redacted]w[redacted]d  Pt presents with 35WKS, BP  Pregnancy complicated by PIH and now mild preeclampsia PMHX  Past Medical History:  Diagnosis Date  . Anxiety     PSURG HX  Past Surgical History:  Procedure Laterality Date  . NO PAST SURGERIES    . none      Fam HX  Family History  Problem Relation Age of Onset  . Hypertension Mother   . Psychiatric Illness Mother   . Thyroid disease Mother   . Breast cancer Maternal Grandmother   . Cancer Paternal Grandmother     unknown type  . Colon cancer Neg Hx   . Esophageal cancer Neg Hx     Soc HX  Social History   Social History  . Marital status: Single    Spouse name: N/A  . Number of children: N/A  . Years of education: N/A   Occupational History  . Not on file.   Social History Main Topics  . Smoking status: Current Some Day Smoker  . Smokeless tobacco: Never Used     Comment: form given 03-10-14  . Alcohol use No     Comment: Rarely  . Drug use: No  . Sexual activity: Yes    Birth control/ protection: None   Other Topics Concern  . Not on file   Social History Narrative  . No narrative on file    ROS  Review of Systems - Negative except as above  [place] SIUP vtx position.  AFI12   EFW5#   32%  Gen WDWN  IN NAD CV RRR LUNGS CTAB ABD Gravid soft NT GU def EXT neg homans sign B A/P: IUP [redacted]w[redacted]d SP Admit to antepartum Mild preeclampsia BMZ [place] for growth MFM consult 24 hour urine Pt no longer with a HA resolved with tylenol

## 2015-08-14 NOTE — Progress Notes (Signed)
Gerhard Perches Female, 25 y.o., [birth year]  25 year old G1 P0 at 35 weeks 6 days with preeclampsia without severe features  Subjective: Patient reports no complaints.  She denies headaches/scotomata/ blurry vision/ chest pain/ shortness of breath/nausea or vomiting/abdominal pain.  She denies contractions or leakage of fluid. Within normal gross fetal movement.    Objective: Vitals:   08/13/15 2137 08/14/15 0602 08/14/15 0839 08/14/15 1151  BP:  131/80 129/80   Pulse:  (!) 104 (!) 115 (!) 121  Resp:  18 16 16   Temp:  98.3 F (36.8 C) 97.8 F (36.6 C) 98 F (36.7 C)  TempSrc:  Oral Oral Oral  Weight: 84.8 kg (187 lb)     Height: 5\' 1"  (1.549 m)       General: alert, cooperative and no distress Resp: clear to auscultation bilaterally Cardio: regular rate and rhythm, S1, S2 normal, no murmur, click, rub or gallop GI: soft, non-tender; bowel sounds normal; no masses,  no organomegaly Extremities: extremities normal, atraumatic, no cyanosis or edema and edema 1+ edema in the legs bilaterally Gravid uterus.  NST @ 10.24 am: 120 BL, mod var, Reactive.  + one early deceleration.   TOCO: 1 contraction in 1 hr.   7/29: 24 hr urine total protein 604 mg. CBC    Component Value Date/Time   WBC 13.2 (H) 08/13/2015 0752   RBC 3.51 (L) 08/13/2015 0752   HGB 10.6 (L) 08/13/2015 0752   HCT 30.7 (L) 08/13/2015 0752   PLT 282 08/13/2015 0752   MCV 87.5 08/13/2015 0752   MCH 30.2 08/13/2015 0752   MCHC 34.5 08/13/2015 0752   RDW 12.1 08/13/2015 0752   LYMPHSABS 2.1 03/10/2014 1128   MONOABS 0.3 03/10/2014 1128   EOSABS 0.1 03/10/2014 1128   BASOSABS 0.0 03/10/2014 1128   CMP     Component Value Date/Time   NA 134 (L) 08/13/2015 0752   K 4.0 08/13/2015 0752   CL 104 08/13/2015 0752   CO2 21 (L) 08/13/2015 0752   GLUCOSE 92 08/13/2015 0752   BUN 8 08/13/2015 0752   CREATININE 0.54 08/13/2015 0752   CALCIUM 8.8 (L) 08/13/2015 0752   PROT 7.1 08/13/2015 0752   ALBUMIN 3.0 (L)  08/13/2015 0752   AST 19 08/13/2015 0752   ALT 11 (L) 08/13/2015 0752   ALKPHOS 230 (H) 08/13/2015 0752   BILITOT 0.7 08/13/2015 0752   GFRNONAA >60 08/13/2015 0752   GFRAA >60 08/13/2015 0752    . docusate sodium  100 mg Oral Daily  . NIFEdipine  30 mg Oral Daily  . prenatal multivitamin  1 tablet Oral Q1200  . sodium chloride flush  3 mL Intravenous Q12H   Assessment/Plan: 25 year old G1 P0 at 35 weeks 6 days with preeclampsia without severe features  Continue with in-house management, awaiting MFM consultation  Continue with close monitoring of fetus  and mother   LOS: 1 day    [facility] 08/14/2015, 2:40 PM

## 2015-08-15 ENCOUNTER — Inpatient Hospital Stay (HOSPITAL_COMMUNITY)
Admit: 2015-08-15 | Discharge: 2015-08-15 | Disposition: A | Discharge status: Home / Self Care | Payer: Managed Care, Other (non HMO) | Attending: Obstetrics and Gynecology | Admitting: Obstetrics and Gynecology

## 2015-08-15 NOTE — Consult Note (Signed)
Maternal Fetal Medicine Consultation  Requesting Provider(s): Waymon Amato, MD  Primary OB: [facility] Reason for consultation: Preeclampsia without severe features  HPI: Miranda Adams is a 25 yo G1P0, EDD 09/12/2015 who is currently at [redacted]w[redacted]d admitted on 7/28 due to preeclampsia without severe features.  She was seen for consultation and delivery recommendations.  Miranda Adams reports that over the last month she has had significant swelling.  On 7/28 she was seen in the clinic and noted to have now onset 2+ proteinuria on urine dip.  During her evaluation in the MAU, she was noted to be hypertensive in the 150's/ low 100s.  She initially had some headaches that have since resolved.  Her 24-hr urine protein was 604 mg/24 hrs.  The remainder of her preeclampsia labs were within normal limits.  Ultrasound yesterday showed a fetus in the cephalic presentation with an EFW at the 32nd %tile and normal fluid.  She is asymptomatic today and denies HA, visual changes or RUQ pain.  The fetus is active.  She denies a history of chronic hypertension and her prenatal course was otherwise uncomplicated.  She was recently started on Procardia XL 30 mg daily.  OB History: OB History    Gravida Para Term Preterm AB Living   1             SAB TAB Ectopic Multiple Live Births                  PMH:  Past Medical History:  Diagnosis Date  . Anxiety     PSH:  Past Surgical History:  Procedure Laterality Date  . NO PAST SURGERIES    . none     Meds:  Scheduled Meds: . docusate sodium  100 mg Oral Daily  . NIFEdipine  30 mg Oral Daily  . prenatal multivitamin  1 tablet Oral Q1200  . sodium chloride flush  3 mL Intravenous Q12H   Continuous Infusions:  PRN Meds:.sodium chloride, acetaminophen, calcium carbonate, sodium chloride flush, zolpidem   Allergies:  Allergies  Allergen Reactions  . Iodine Anaphylaxis and Hives  . Shellfish Allergy Anaphylaxis and Hives   FH:  Family History  Problem Relation  Age of Onset  . Hypertension Mother   . Psychiatric Illness Mother   . Thyroid disease Mother   . Breast cancer Maternal Grandmother   . Cancer Paternal Grandmother     unknown type  . Colon cancer Neg Hx   . Esophageal cancer Neg Hx    Soc:  Social History   Social History  . Marital status: Single    Spouse name: N/A  . Number of children: N/A  . Years of education: N/A   Occupational History  . Not on file.   Social History Main Topics  . Smoking status: Current Some Day Smoker  . Smokeless tobacco: Never Used     Comment: form given 03-10-14  . Alcohol use No     Comment: Rarely  . Drug use: No  . Sexual activity: Yes    Birth control/ protection: None   Other Topics Concern  . Not on file   Social History Narrative  . No narrative on file    Review of Systems: no vaginal bleeding or cramping/contractions, no LOF, no nausea/vomiting. All other systems reviewed and are negative.  PE:   Vitals:   08/14/15 2300 08/15/15 1005  BP: 127/68 135/85  Pulse: (!) 101 97  Resp: 20 16  Temp: 97.9 F (36.6 C) 98.2  F (36.8 C)    Labs: CBC    Component Value Date/Time   WBC 13.2 (H) 08/13/2015 0752   RBC 3.51 (L) 08/13/2015 0752   HGB 10.6 (L) 08/13/2015 0752   HCT 30.7 (L) 08/13/2015 0752   PLT 282 08/13/2015 0752   MCV 87.5 08/13/2015 0752   MCH 30.2 08/13/2015 0752   MCHC 34.5 08/13/2015 0752   RDW 12.1 08/13/2015 0752   LYMPHSABS 2.1 03/10/2014 1128   MONOABS 0.3 03/10/2014 1128   EOSABS 0.1 03/10/2014 1128   BASOSABS 0.0 03/10/2014 1128   CMP     Component Value Date/Time   NA 134 (L) 08/13/2015 0752   K 4.0 08/13/2015 0752   CL 104 08/13/2015 0752   CO2 21 (L) 08/13/2015 0752   GLUCOSE 92 08/13/2015 0752   BUN 8 08/13/2015 0752   CREATININE 0.54 08/13/2015 0752   CALCIUM 8.8 (L) 08/13/2015 0752   PROT 7.1 08/13/2015 0752   ALBUMIN 3.0 (L) 08/13/2015 0752   AST 19 08/13/2015 0752   ALT 11 (L) 08/13/2015 0752   ALKPHOS 230 (H) 08/13/2015  0752   BILITOT 0.7 08/13/2015 0752   GFRNONAA >60 08/13/2015 0752   GFRAA >60 08/13/2015 0752    A/P: 1) Single IUP at 36w 0d  2) Preeclampsia without severe features - Miranda Adams has had some labile blood pressures as high as 150's/100's but has improved on her current dose of procardia. 24-hr urine showed 604 mg or protein/24 hours. Her headaches are resolved and she is otherwise asymptomatic.  She completed a course of betamethasone over the weekend.  Due to her labile blood pressures, I would be hesitant to discharge her and would recommend continued inpatient management until 37 weeks and if stable, would recommend delivery at that time.  Recommendations: 1) Continue inpatient management 2) 2-3 x weekly preeclampsia labs (CBC, CMP) 3) Continue Procardia XL 30 mg as written.  If blood pressure medications need to be increased, would use this as an indication for delivery given her current gestational age. 4) At least daily fetal strips or more frequent as clinically indicated 4) Would move toward delivery if the patient develops severe features: (plts < 100 K, LFTs > 2x normal, severe headaches that do not improved with medications, Creat > 1.2 mg/dl, non-reassuring fetal testing, severe range blood pressures with SBP > or equal to 160; DBP > or equal to 110) 5) Recommend delivery at 37 weeks if otherwise stable.  Would begin Magnesium sulfate once a decision is made to move toward delivery and would continue for 24 hours post delivery.   Thank you for the opportunity to be a part of the care of [employer]. Please contact our office if we can be of further assistance.   I spent approximately 30 minutes with this patient with over 50% of time spent in face-to-face counseling.  Benjaman Lobe, MD Maternal Fetal Medicine

## 2015-08-15 NOTE — Progress Notes (Addendum)
Patient ID: Miranda Adams, female   DOB: April 03, 1990, 25 y.o.   MRN: [id number] Miranda Adams is a 25 y.o. G1P0 at [redacted]w[redacted]d admitted for preeclampsia without severe features  Subjective: Denies HA, visual changes, abdominal pain, reports good FM and denies contractions, LOF or VB.  Objective: BP 135/85   Pulse 97   Temp 98.2 F (36.8 C) (Oral)   Resp 16   Ht 5\' 1"  (1.549 m)   Wt 187 lb (84.8 kg)   BMI 35.33 kg/m  No intake/output data recorded. No intake/output data recorded.  Physical Exam:  Gen: alert Chest/Lungs: cta bilaterally  Heart/Pulse: RRR  Abdomen: soft, gravid, nontender Uterine fundus: soft, nontender Skin & Color: warm and dry  EXT: negative Homan's b/l, edema none  FHT:  FHR: 140s bpm, variability: minimal to moderate,  accelerations:  Present occasionally,  decelerations:  Absent UC:   none SVE:    deferred  Labs: Lab Results  Component Value Date   WBC 13.2 (H) 08/13/2015   HGB 10.6 (L) 08/13/2015   HCT 30.7 (L) 08/13/2015   MCV 87.5 08/13/2015   PLT 282 08/13/2015    Assessment and Plan: has Hypertension in pregnancy, preeclampsia and Pre-eclampsia in third trimester on her problem list. Recheck labs in am  MFM consult appreciated - As per Dr. Lisbeth Renshaw  1) Continue inpatient management 2) 2-3 x weekly preeclampsia labs (CBC, CMP) 3) Continue Procardia XL 30 mg as written.  If blood pressure medications need to be increased, would use this as an indication for delivery given her current gestational age. 4) At least daily fetal strips or more frequent as clinically indicated 4) Would move toward delivery if the patient develops severe features: (plts < 100 K, LFTs > 2x normal, severe headaches that do not improved with medications, Creat > 1.2 mg/dl, non-reassuring fetal testing, severe range blood pressures with SBP > or equal to 160; DBP > or equal to 110) 5) Recommend delivery at 37 weeks if otherwise stable.  Would begin Magnesium sulfate  once a decision is made to move toward delivery and would continue for 24 hours post delivery  Fetal status is overall reassuring with cat 1 tracing  Asymptomatic for severe features  Erasmo Vertz Y 08/15/2015, 2:38 PM

## 2015-08-16 ENCOUNTER — Encounter (HOSPITAL_COMMUNITY): Payer: Managed Care, Other (non HMO)

## 2015-08-16 LAB — TYPE AND SCREEN
ABO/RH(D): B POS
ANTIBODY SCREEN: NEGATIVE

## 2015-08-16 LAB — COMPREHENSIVE METABOLIC PANEL
ALBUMIN: 2.5 g/dL — AB (ref 3.5–5.0)
ALT: 11 U/L — ABNORMAL LOW (ref 14–54)
ANION GAP: 5 (ref 5–15)
AST: 16 U/L (ref 15–41)
Alkaline Phosphatase: 194 U/L — ABNORMAL HIGH (ref 38–126)
BUN: 11 mg/dL (ref 6–20)
CALCIUM: 8.4 mg/dL — AB (ref 8.9–10.3)
CO2: 22 mmol/L (ref 22–32)
Chloride: 108 mmol/L (ref 101–111)
Creatinine, Ser: 0.57 mg/dL (ref 0.44–1.00)
GFR calc non Af Amer: >60 mL/min (ref >60)
GLUCOSE: 74 mg/dL (ref 65–99)
POTASSIUM: 4.2 mmol/L (ref 3.5–5.1)
SODIUM: 135 mmol/L (ref 135–145)
TOTAL PROTEIN: 5.6 g/dL — AB (ref 6.5–8.1)
Total Bilirubin: 0.5 mg/dL (ref 0.3–1.2)

## 2015-08-16 LAB — CBC
HCT: 27.8 % — ABNORMAL LOW (ref 36.0–46.0)
Hemoglobin: 9.5 g/dL — ABNORMAL LOW (ref 12.0–15.0)
MCH: 30.4 pg (ref 26.0–34.0)
MCHC: 34.2 g/dL (ref 30.0–36.0)
MCV: 89.1 fL (ref 78.0–100.0)
Platelets: 242 10*3/uL (ref 150–400)
RBC: 3.12 MIL/uL — ABNORMAL LOW (ref 3.87–5.11)
RDW: 12.5 % (ref 11.5–15.5)
WBC: 12.5 10*3/uL — ABNORMAL HIGH (ref 4.0–10.5)

## 2015-08-16 MED ORDER — FERROUS SULFATE 325 (65 FE) MG PO TABS
325.0000 mg | ORAL_TABLET | Freq: Every day | ORAL | Status: DC
  Administered 2015-08-17 – 2015-08-23 (×6): 325 mg via ORAL
  Filled 2015-08-16 (×7): qty 1

## 2015-08-16 NOTE — Progress Notes (Addendum)
  Miranda Adams Female, 25 y.o., 09-23-1990  G1 P0 at 36 weeks 1 day EGA with preeclampsia without severe features  Subjective: Patient reports no complaints.    Objective: I have reviewed patient's vital signs. Vitals:   08/15/15 1005 08/15/15 2235 08/16/15 0751 08/16/15 1204  BP: 135/85  119/79 (!) 139/91  Pulse: 97  78 95  Resp: 16 16 18 20   Temp: 98.2 F (36.8 C) 98.3 F (36.8 C) 98.5 F (36.9 C) 98.1 F (36.7 C)  TempSrc: Oral Oral Oral Oral  SpO2:  100%    Weight:      Height:        General: alert, cooperative and no distress Resp: clear to auscultation bilaterally Cardio: regular rate and rhythm, S1, S2 normal, no murmur, click, rub or gallop GI: soft, non-tender; bowel sounds normal; no masses,  no organomegaly Extremities: extremities normal, atraumatic, no cyanosis or edema EFM: 150 BL, mod var, Reactive.   TOCO: + irregular contractions Q 14 minutes  CBC    Component Value Date/Time   WBC 12.5 (H) 08/16/2015 0554   RBC 3.12 (L) 08/16/2015 0554   HGB 9.5 (L) 08/16/2015 0554   HCT 27.8 (L) 08/16/2015 0554   PLT 242 08/16/2015 0554   MCV 89.1 08/16/2015 0554   MCH 30.4 08/16/2015 0554   MCHC 34.2 08/16/2015 0554   RDW 12.5 08/16/2015 0554   LYMPHSABS 2.1 03/10/2014 1128   MONOABS 0.3 03/10/2014 1128   EOSABS 0.1 03/10/2014 1128   BASOSABS 0.0 03/10/2014 1128   CMP     Component Value Date/Time   NA 135 08/16/2015 0554   K 4.2 08/16/2015 0554   CL 108 08/16/2015 0554   CO2 22 08/16/2015 0554   GLUCOSE 74 08/16/2015 0554   BUN 11 08/16/2015 0554   CREATININE 0.57 08/16/2015 0554   CALCIUM 8.4 (L) 08/16/2015 0554   PROT 5.6 (L) 08/16/2015 0554   ALBUMIN 2.5 (L) 08/16/2015 0554   AST 16 08/16/2015 0554   ALT 11 (L) 08/16/2015 0554   ALKPHOS 194 (H) 08/16/2015 0554   BILITOT 0.5 08/16/2015 0554   GFRNONAA >60 08/16/2015 0554   GFRAA >60 08/16/2015 0554    Assessment/Plan:  Preeclampsia without severe features, with stable maternal and  fetal status  Continue with inpatient management as per MFM consultation  Continue with 3 times weekly preeclampsia labs next due on 08/18/15  Continue with Procardia XL for high blood pressures  Plan is for delivery at [redacted] weeks EGA but sooner if she develops severe features of preeclampsia.      LOS: 3 days    [facility] 08/16/2015, 10:46 AM

## 2015-08-17 NOTE — Progress Notes (Signed)
Patient ID: Miranda Adams, female   DOB: [birth year], 25 y.o.   MRN: [id number] Pt without complaints.  No leakage of fluid or VB.  Good FM. No HA, blurred vision or RUQ pain BP (!) 147/95 (BP Location: Left Arm)   Pulse 94   Temp 97.8 F (36.6 C) (Oral)   Resp 16   Ht _0  (1.549 m)   Wt 191 lb 6.4 oz (86.8 kg)   SpO2 100%   BMI 36.16 kg/m   FHTS cat 1  Toco none  Pt in NAD CV RRR Lungs CTAB abd  Gravid soft and NT GU no vb EXt no calf tenderness Results for orders placed or performed during the hospital encounter of 08/13/15 (from the past 72 hour(s))  CBC     Status: Abnormal   Collection Time: 08/16/15  5:54 AM  Result Value Ref Range   WBC 12.5 (H) 4.0 - 10.5 K/uL   RBC 3.12 (L) 3.87 - 5.11 MIL/uL   Hemoglobin 9.5 (L) 12.0 - 15.0 g/dL   HCT 27.8 (L) 36.0 - 46.0 %   MCV 89.1 78.0 - 100.0 fL   MCH 30.4 26.0 - 34.0 pg   MCHC 34.2 30.0 - 36.0 g/dL   RDW 12.5 11.5 - 15.5 %   Platelets 242 150 - 400 K/uL  Comprehensive metabolic panel     Status: Abnormal   Collection Time: 08/16/15  5:54 AM  Result Value Ref Range   Sodium 135 135 - 145 mmol/L   Potassium 4.2 3.5 - 5.1 mmol/L   Chloride 108 101 - 111 mmol/L   CO2 22 22 - 32 mmol/L   Glucose, Bld 74 65 - 99 mg/dL   BUN 11 6 - 20 mg/dL   Creatinine, Ser 0.57 0.44 - 1.00 mg/dL   Calcium 8.4 (L) 8.9 - 10.3 mg/dL   Total Protein 5.6 (L) 6.5 - 8.1 g/dL   Albumin 2.5 (L) 3.5 - 5.0 g/dL   AST 16 15 - 41 U/L   ALT 11 (L) 14 - 54 U/L   Alkaline Phosphatase 194 (H) 38 - 126 U/L   Total Bilirubin 0.5 0.3 - 1.2 mg/dL   GFR calc non Af Amer >60 >60 mL/min   GFR calc Af Amer >60 >60 mL/min    Comment: (NOTE) The eGFR has been calculated using the CKD EPI equation. This calculation has not been validated in all clinical situations. eGFR's persistently <60 mL/min signify possible Chronic Kidney Disease.    Anion gap 5 5 - 15  Type and screen [place]     Status: None   Collection Time: 08/16/15   5:54 AM  Result Value Ref Range   ABO/RH(D) B POS    Antibody Screen NEG    Sample Expiration 08/19/2015     Assessment and Plan [redacted]w[redacted]d Preeclampsia without severe features S/p BMZ Plan for induction at 37 weeks unless she develops severe featutes

## 2015-08-18 LAB — COMPREHENSIVE METABOLIC PANEL
ALBUMIN: 2.5 g/dL — AB (ref 3.5–5.0)
ALK PHOS: 194 U/L — AB (ref 38–126)
ALT: 18 U/L (ref 14–54)
AST: 21 U/L (ref 15–41)
Anion gap: 7 (ref 5–15)
BILIRUBIN TOTAL: 0.5 mg/dL (ref 0.3–1.2)
BUN: 10 mg/dL (ref 6–20)
CALCIUM: 8.3 mg/dL — AB (ref 8.9–10.3)
CO2: 23 mmol/L (ref 22–32)
CREATININE: 0.66 mg/dL (ref 0.44–1.00)
Chloride: 106 mmol/L (ref 101–111)
GFR calc Af Amer: >60 mL/min (ref >60)
GLUCOSE: 77 mg/dL (ref 65–99)
POTASSIUM: 4.4 mmol/L (ref 3.5–5.1)
Sodium: 136 mmol/L (ref 135–145)
TOTAL PROTEIN: 6.1 g/dL — AB (ref 6.5–8.1)

## 2015-08-18 LAB — URIC ACID: Uric Acid, Serum: 5.6 mg/dL (ref 2.3–6.6)

## 2015-08-18 LAB — CBC
HEMATOCRIT: 26.7 % — AB (ref 36.0–46.0)
HEMOGLOBIN: 9.2 g/dL — AB (ref 12.0–15.0)
MCH: 30.5 pg (ref 26.0–34.0)
MCHC: 34.5 g/dL (ref 30.0–36.0)
MCV: 88.4 fL (ref 78.0–100.0)
Platelets: 224 10*3/uL (ref 150–400)
RBC: 3.02 MIL/uL — ABNORMAL LOW (ref 3.87–5.11)
RDW: 12.5 % (ref 11.5–15.5)
WBC: 10.7 10*3/uL — AB (ref 4.0–10.5)

## 2015-08-18 LAB — LACTATE DEHYDROGENASE: LDH: 156 U/L (ref 98–192)

## 2015-08-18 MED ORDER — ACETAMINOPHEN 500 MG PO TABS
1000.0000 mg | ORAL_TABLET | Freq: Four times a day (QID) | ORAL | Status: DC | PRN
  Administered 2015-08-18 (×2): 1000 mg via ORAL
  Filled 2015-08-18 (×2): qty 2

## 2015-08-18 MED ORDER — ACETAMINOPHEN 500 MG PO TABS: 1000.0000 mg | ORAL_TABLET | ORAL | Status: DC | PRN

## 2015-08-18 NOTE — Progress Notes (Addendum)
Miranda Adams Female, 25 y.o., [birth year]  G1 P0 at 36 weeks 3 days EGA with preeclampsia without severe features  Subjective: Patient reports headache, 8/10 intensity, right sided.  She denies scotomata/blurry vision/chest pain/shortness of breath/nausea/vomiting/abdominal pain.  Denies contractions/vaginal bleeding or leakage of fluid.  With normal gross fetal movement.         Objective: I have reviewed patient's vital signs, medications and labs. Vitals:   08/17/15 2218 08/18/15 0410 08/18/15 0901 08/18/15 0903  BP:  (!) 140/91  128/81  Pulse:  97  78  Resp: 18 20 16    Temp:  98.2 F (36.8 C) 97.9 F (36.6 C)   TempSrc:  Oral Oral   SpO2:      Weight:      Height:        General: alert, cooperative and no distress Resp: clear to auscultation bilaterally Cardio: regular rate and rhythm, S1, S2 normal, no murmur, click, rub or gallop GI: soft, non-tender; bowel sounds normal; no masses,  no organomegaly Extremities: edema 2+ in right leg and foot, 1+ in left leg and foot.  Patellar Reflex 2+ bilaterally.    EFM: 140 BL, mod var, reactive. 1 variable deceleration, followed by category 1 tracing.   TOCO: No contractions.    CBC    Component Value Date/Time   WBC 10.7 (H) 08/18/2015 0818   RBC 3.02 (L) 08/18/2015 0818   HGB 9.2 (L) 08/18/2015 0818   HCT 26.7 (L) 08/18/2015 0818   PLT 224 08/18/2015 0818   MCV 88.4 08/18/2015 0818   MCH 30.5 08/18/2015 0818   MCHC 34.5 08/18/2015 0818   RDW 12.5 08/18/2015 0818   LYMPHSABS 2.1 03/10/2014 1128   MONOABS 0.3 03/10/2014 1128   EOSABS 0.1 03/10/2014 1128   BASOSABS 0.0 03/10/2014 1128   CMP     Component Value Date/Time   NA 136 08/18/2015 0818   K 4.4 08/18/2015 0818   CL 106 08/18/2015 0818   CO2 23 08/18/2015 0818   GLUCOSE 77 08/18/2015 0818   BUN 10 08/18/2015 0818   CREATININE 0.66 08/18/2015 0818   CALCIUM 8.3 (L) 08/18/2015 0818   PROT 6.1 (L) 08/18/2015 0818   ALBUMIN 2.5 (L) 08/18/2015 0818   AST 21 08/18/2015 0818   ALT 18 08/18/2015 0818   ALKPHOS 194 (H) 08/18/2015 0818   BILITOT 0.5 08/18/2015 0818   GFRNONAA >60 08/18/2015 0818   GFRAA >60 08/18/2015 0818     . docusate sodium  100 mg Oral Daily  . ferrous sulfate  325 mg Oral Q breakfast  . NIFEdipine  30 mg Oral Daily  . prenatal multivitamin  1 tablet Oral Q1200  . sodium chloride flush  3 mL Intravenous Q12H    Assessment/Plan:  Preeclampsia without severe features at 22 W 3 D EGA, with stable maternal and fetal status, patient with headache now, tylenol for headache, has had headache before that resolved, if she continues with nonresolving headache will meet severe features criteria hence will proceed with delivery  Continue with inpatient management as per MFM consultation  Continue with 3 times weekly preeclampsia labs  Continue with Procardia XL for high blood pressures  Plan is for delivery at [redacted] weeks EGA but sooner if she develops severe features of preeclampsia.      LOS: 5 days    [facility] 08/18/2015, 11:40 AM   I called up to the floor and spoke to RN about patient's headache, RN states patient has been resting and has  not called out for headache or requested any more pain medication.  Will continue to monitor.   08/18/2015  1500 Miranda Sevcik, MD.

## 2015-08-18 NOTE — Progress Notes (Signed)
[place] labs placed for draw today. Due q 3 days (last done 08/16/15)  Donnel Saxon, CNM 08/18/15 [phone]

## 2015-08-19 LAB — TYPE AND SCREEN
ABO/RH(D): B POS
ANTIBODY SCREEN: NEGATIVE

## 2015-08-19 NOTE — Progress Notes (Signed)
25 y.o. year old female,at [redacted]w[redacted]d gestation. The patient was admitted to the hospital on 08/13/2015 with a diagnosis of preeclampsia without severe features. She has been monitored carefully. The patient is currently taking Procardia XL 30 mg daily. She will remain in the hospital until delivery. Preeclampsia labs were normal on 08/18/2015. She is scheduled for induction at [redacted] weeks gestation. Vertex presentation confirmed by ultrasound in maternal fetal medicine on 08/13/2015.  SUBJECTIVE:  The patient reports that she is doing well today. She denies headaches, blurred vision, and right upper quadrant tenderness.  OBJECTIVE:  BP 133/86 (BP Location: Right Arm)   Pulse 96   Temp 98.2 F (36.8 C) (Oral)   Resp 16   Ht 5\' 1"  (1.549 m)   Wt 191 lb 6.4 oz (86.8 kg)   SpO2 100%   BMI 36.16 kg/m   Fetal Heart Tones:  Category 1  Contractions:          None  HEENT: Within normal limits Abdomen: Nontender Reflexes: Normal. No clonus.    ASSESSMENT:  [redacted]w[redacted]d Weeks Pregnancy  Preeclampsia without severe features  Stable  PLAN:  Continue in-hospital care.  Continue Procardia XL 30 mg daily.  Induction is scheduled for [redacted] weeks gestation. If the patient develops severe features, then it is appropriate to proceed with induction. Magnesium therapy is recommended.  Gildardo Cranker, M.D. 08/19/2015 6:27 PM

## 2015-08-20 NOTE — Progress Notes (Addendum)
25 y.o. year old female,at [redacted]w[redacted]d gestation. The patient was admitted to the hospital on 08/13/2015 with a diagnosis of preeclampsia without severe features. She has been monitored carefully. The patient is currently taking Procardia XL 30 mg daily. She will remain in the hospital until delivery. Preeclampsia labs were normal on 08/18/2015. She is scheduled for induction at [redacted] weeks gestation. Vertex presentation confirmed by ultrasound in maternal fetal medicine on 08/13/2015.  SUBJECTIVE: Pt without complaints this morning.  States she had a severe headache 2 days ago that lasted all day.  She felt she has headaches when SCDs are on and that particular day she had them on all day.  Good fetal movement.    OBJECTIVE:  BP 139/85 (BP Location: Right Arm)   Pulse (!) 104   Temp 98.4 F (36.9 C) (Oral)   Resp 16   Ht 5\' 1"  (1.549 m)   Wt 86.8 kg (191 lb 6.4 oz)   SpO2 100%   BMI 36.16 kg/m   Fetal Heart Tones: 140s, Reactive, good variability, Moderate variable deceleration occasionally.  Contractions:          None  HEENT: Within normal limits Abdomen: Nontender  Reflexes: Normal. No clonus. Edema: 1-2+ pitting edema, no calf tenderness  CBC    Component Value Date/Time   WBC 10.7 (H) 08/18/2015 0818   RBC 3.02 (L) 08/18/2015 0818   HGB 9.2 (L) 08/18/2015 0818   HCT 26.7 (L) 08/18/2015 0818   PLT 224 08/18/2015 0818   MCV 88.4 08/18/2015 0818   MCH 30.5 08/18/2015 0818   MCHC 34.5 08/18/2015 0818   RDW 12.5 08/18/2015 0818   LYMPHSABS 2.1 03/10/2014 1128   MONOABS 0.3 03/10/2014 1128   EOSABS 0.1 03/10/2014 1128   BASOSABS 0.0 03/10/2014 1128   CMP Latest Ref Rng & Units 08/18/2015 08/16/2015 08/13/2015  Glucose 65 - 99 mg/dL 77 74 92  BUN 6 - 20 mg/dL 10 11 8   Creatinine 0.44 - 1.00 mg/dL 0.66 0.57 0.54  Sodium 135 - 145 mmol/L 136 135 134(L)  Potassium 3.5 - 5.1 mmol/L 4.4 4.2 4.0  Chloride 101 - 111 mmol/L 106 108 104  CO2 22 - 32 mmol/L 23 22 21(L)  Calcium 8.9 - 10.3  mg/dL 8.3(L) 8.4(L) 8.8(L)  Total Protein 6.5 - 8.1 g/dL 6.1(L) 5.6(L) 7.1  Total Bilirubin 0.3 - 1.2 mg/dL 0.5 0.5 0.7  Alkaline Phos 38 - 126 U/L 194(H) 194(H) 230(H)  AST 15 - 41 U/L 21 16 19   ALT 14 - 54 U/L 18 11(L) 11(L)    ASSESSMENT:  [redacted]w[redacted]d Weeks Pregnancy Preeclampsia without severe features.  Labs normal and stable. Headache resolved. Cat II tracing, occasional variable deceleration.  Overall reassuring.  Stable  PLAN:  Continue in-hospital care.  Check weight today.  Continue Procardia XL 30 mg daily.  Tylenol 1g po q 8 hours prn headache.  Consider placing SCDs intermittently.  Induction is scheduled for [redacted] weeks gestation. If the patient develops severe features, then it is appropriate to proceed with induction. Magnesium therapy is recommended.  Meridee Score, MD

## 2015-08-21 LAB — TYPE AND SCREEN
ABO/RH(D): B POS
ANTIBODY SCREEN: NEGATIVE

## 2015-08-21 LAB — GROUP B STREP BY PCR: GROUP B STREP BY PCR: NEGATIVE

## 2015-08-21 LAB — OB RESULTS CONSOLE GBS: STREP GROUP B AG: NEGATIVE

## 2015-08-21 NOTE — Progress Notes (Signed)
In to discuss plan for initiation of induction at midnight due to 37 weeks.  Risks and benefits of induction were reviewed, including failure of method, prolonged labor, need for further intervention, risk of cesarean.  Patient seems to understand these risks and wish to proceed.  Reviewed process of induction--discussed use of Cytotech, foley bulb, AROM, and pitocin as options.  Also discussed potential use of magnesium during labor, and definitely for 24 hours pp.  Per consult with Dr. Nelda Marseille, will defer mag at present unless onset of severe features. Continue Procardia 30 mg XL daily. Repeat [place] labs on transfer to [facility], and repeat T&S. GBS by PCR now.  Donnel Saxon, CNM 08/21/15 8:53p

## 2015-08-21 NOTE — Progress Notes (Signed)
OB ANTE Progress Note:  SUBJECTIVE: Pt resting comfortably in bed.  She reports no headache, no blurry vision, no RUQ pain.  Tolerating gen diet.  +FM, no ctx/vb/lof.  No F/C/CP/SOB.  No acute complaints.   OBJECTIVE:  BP 120/75 (BP Location: Right Arm)   Pulse 80   Temp 98.6 F (37 C) (Oral)   Resp 15   Ht 5\' 1"  (1.549 m)   Wt 86.9 kg (191 lb 9.6 oz)   SpO2 100%   BMI 36.20 kg/m  BP range (24hr): 120-154/75-97  Fetal Heart Tones: 140s, Reactive, good variability, Moderate variable deceleration occasionally.  Contractions:          None SVE: Deferred   GEN: NAD CV: RRR Lungs: CTAB Abdomen: Nontender, gravid  Reflexes: Normal. No clonus. Edema: No pitting edema, no calf tenderness  CBC    Component Value Date/Time   WBC 10.7 (H) 08/18/2015 0818   RBC 3.02 (L) 08/18/2015 0818   HGB 9.2 (L) 08/18/2015 0818   HCT 26.7 (L) 08/18/2015 0818   PLT 224 08/18/2015 0818   MCV 88.4 08/18/2015 0818   MCH 30.5 08/18/2015 0818   MCHC 34.5 08/18/2015 0818   RDW 12.5 08/18/2015 0818   LYMPHSABS 2.1 03/10/2014 1128   MONOABS 0.3 03/10/2014 1128   EOSABS 0.1 03/10/2014 1128   BASOSABS 0.0 03/10/2014 1128   CMP Latest Ref Rng & Units 08/18/2015 08/16/2015 08/13/2015  Glucose 65 - 99 mg/dL 77 74 92  BUN 6 - 20 mg/dL 10 11 8   Creatinine 0.44 - 1.00 mg/dL 0.66 0.57 0.54  Sodium 135 - 145 mmol/L 136 135 134(L)  Potassium 3.5 - 5.1 mmol/L 4.4 4.2 4.0  Chloride 101 - 111 mmol/L 106 108 104  CO2 22 - 32 mmol/L 23 22 21(L)  Calcium 8.9 - 10.3 mg/dL 8.3(L) 8.4(L) 8.8(L)  Total Protein 6.5 - 8.1 g/dL 6.1(L) 5.6(L) 7.1  Total Bilirubin 0.3 - 1.2 mg/dL 0.5 0.5 0.7  Alkaline Phos 38 - 126 U/L 194(H) 194(H) 230(H)  AST 15 - 41 U/L 21 16 19   ALT 14 - 54 U/L 18 11(L) 11(L)    ASSESSMENT:  [redacted]w[redacted]d Weeks Pregnancy Preeclampsia without severe features.  Labs normal and stable. Cat I Stable  PLAN:  Continue in-hospital care.   Continue Procardia XL 30 mg daily.  Induction scheduled for  [redacted] weeks gestation. If the patient develops severe features, then it is appropriate to proceed with induction sooner and start IV Magnesium.  Janyth Pupa, DO [phone] (pager) [phone] (office)

## 2015-08-22 ENCOUNTER — Inpatient Hospital Stay (HOSPITAL_COMMUNITY): Payer: Managed Care, Other (non HMO) | Admitting: Anesthesiology

## 2015-08-22 ENCOUNTER — Encounter (HOSPITAL_COMMUNITY): Payer: Self-pay | Admitting: Anesthesiology

## 2015-08-22 LAB — COMPREHENSIVE METABOLIC PANEL
ALBUMIN: 2.7 g/dL — AB (ref 3.5–5.0)
ALK PHOS: 220 U/L — AB (ref 38–126)
ALT: 22 U/L (ref 14–54)
AST: 22 U/L (ref 15–41)
Anion gap: 6 (ref 5–15)
BILIRUBIN TOTAL: 0.5 mg/dL (ref 0.3–1.2)
BUN: 9 mg/dL (ref 6–20)
CALCIUM: 8.8 mg/dL — AB (ref 8.9–10.3)
CO2: 24 mmol/L (ref 22–32)
CREATININE: 0.63 mg/dL (ref 0.44–1.00)
Chloride: 105 mmol/L (ref 101–111)
GFR calc Af Amer: >60 mL/min (ref >60)
GLUCOSE: 110 mg/dL — AB (ref 65–99)
Potassium: 3.9 mmol/L (ref 3.5–5.1)
Sodium: 135 mmol/L (ref 135–145)
TOTAL PROTEIN: 6.3 g/dL — AB (ref 6.5–8.1)

## 2015-08-22 LAB — CBC
HEMATOCRIT: 30.3 % — AB (ref 36.0–46.0)
HEMOGLOBIN: 10.5 g/dL — AB (ref 12.0–15.0)
MCH: 31.6 pg (ref 26.0–34.0)
MCHC: 34.7 g/dL (ref 30.0–36.0)
MCV: 91.3 fL (ref 78.0–100.0)
Platelets: 231 10*3/uL (ref 150–400)
RBC: 3.32 MIL/uL — ABNORMAL LOW (ref 3.87–5.11)
RDW: 12.8 % (ref 11.5–15.5)
WBC: 10.6 10*3/uL — AB (ref 4.0–10.5)

## 2015-08-22 LAB — HIV ANTIBODY (ROUTINE TESTING W REFLEX): HIV Screen 4th Generation wRfx: NONREACTIVE

## 2015-08-22 LAB — LACTATE DEHYDROGENASE: LDH: 180 U/L (ref 98–192)

## 2015-08-22 LAB — RPR: RPR: NONREACTIVE

## 2015-08-22 LAB — URIC ACID: Uric Acid, Serum: 5.6 mg/dL (ref 2.3–6.6)

## 2015-08-22 MED ORDER — OXYTOCIN 40 UNITS IN LACTATED RINGERS INFUSION - SIMPLE MED
1.0000 m[IU]/min | INTRAVENOUS | Status: DC
  Administered 2015-08-22: 1 m[IU]/min via INTRAVENOUS
  Filled 2015-08-22: qty 1000

## 2015-08-22 MED ORDER — SOD CITRATE-CITRIC ACID 500-334 MG/5ML PO SOLN
30.0000 mL | ORAL | Status: DC | PRN
  Administered 2015-08-23: 30 mL via ORAL
  Filled 2015-08-22: qty 15

## 2015-08-22 MED ORDER — LIDOCAINE HCL (PF) 1 % IJ SOLN
30.0000 mL | INTRAMUSCULAR | Status: DC | PRN
  Filled 2015-08-22: qty 30

## 2015-08-22 MED ORDER — ACETAMINOPHEN 325 MG PO TABS
650.0000 mg | ORAL_TABLET | ORAL | Status: DC | PRN
  Administered 2015-08-22: 650 mg via ORAL
  Filled 2015-08-22: qty 2

## 2015-08-22 MED ORDER — EPHEDRINE 5 MG/ML INJ: 10.0000 mg | INTRAVENOUS | Status: DC | PRN

## 2015-08-22 MED ORDER — OXYTOCIN 40 UNITS IN LACTATED RINGERS INFUSION - SIMPLE MED: 2.5000 [IU]/h | INTRAVENOUS | Status: DC

## 2015-08-22 MED ORDER — LIDOCAINE HCL (PF) 1 % IJ SOLN
INTRAMUSCULAR | Status: DC | PRN
  Administered 2015-08-22 (×2): 4 mL via EPIDURAL

## 2015-08-22 MED ORDER — OXYTOCIN BOLUS FROM INFUSION: 500.0000 mL | Freq: Once | INTRAVENOUS | Status: DC

## 2015-08-22 MED ORDER — FENTANYL 2.5 MCG/ML BUPIVACAINE 1/10 % EPIDURAL INFUSION (WH - ANES)
14.0000 mL/h | INTRAMUSCULAR | Status: DC | PRN
  Administered 2015-08-22: 11.5 mL/h via EPIDURAL
  Administered 2015-08-22: 14 mL/h via EPIDURAL
  Filled 2015-08-22 (×2): qty 125

## 2015-08-22 MED ORDER — OXYCODONE-ACETAMINOPHEN 5-325 MG PO TABS: 2.0000 | ORAL_TABLET | ORAL | Status: DC | PRN

## 2015-08-22 MED ORDER — HYDRALAZINE HCL 20 MG/ML IJ SOLN: 10.0000 mg | Freq: Once | INTRAMUSCULAR | Status: DC | PRN

## 2015-08-22 MED ORDER — PHENYLEPHRINE 40 MCG/ML (10ML) SYRINGE FOR IV PUSH (FOR BLOOD PRESSURE SUPPORT)
80.0000 ug | PREFILLED_SYRINGE | INTRAVENOUS | Status: DC | PRN
Start: 2015-08-22 — End: 2015-08-23

## 2015-08-22 MED ORDER — ONDANSETRON HCL 4 MG/2ML IJ SOLN
4.0000 mg | Freq: Four times a day (QID) | INTRAMUSCULAR | Status: DC | PRN
  Administered 2015-08-23: 4 mg via INTRAVENOUS

## 2015-08-22 MED ORDER — ZOLPIDEM TARTRATE 5 MG PO TABS
5.0000 mg | ORAL_TABLET | Freq: Every evening | ORAL | Status: DC | PRN
Start: 2015-08-22 — End: 2015-08-23

## 2015-08-22 MED ORDER — FLEET ENEMA 7-19 GM/118ML RE ENEM: 1.0000 | ENEMA | RECTAL | Status: DC | PRN

## 2015-08-22 MED ORDER — PHENYLEPHRINE 40 MCG/ML (10ML) SYRINGE FOR IV PUSH (FOR BLOOD PRESSURE SUPPORT)
80.0000 ug | PREFILLED_SYRINGE | INTRAVENOUS | Status: DC | PRN
  Filled 2015-08-22: qty 10

## 2015-08-22 MED ORDER — LACTATED RINGERS IV SOLN: 500.0000 mL | INTRAVENOUS | Status: DC | PRN

## 2015-08-22 MED ORDER — LACTATED RINGERS IV SOLN
500.0000 mL | Freq: Once | INTRAVENOUS | Status: AC
  Administered 2015-08-22: 500 mL via INTRAVENOUS

## 2015-08-22 MED ORDER — TERBUTALINE SULFATE 1 MG/ML IJ SOLN
0.2500 mg | Freq: Once | INTRAMUSCULAR | Status: AC | PRN
  Administered 2015-08-23: 0.25 mg via SUBCUTANEOUS
  Filled 2015-08-22: qty 1

## 2015-08-22 MED ORDER — LACTATED RINGERS IV SOLN
INTRAVENOUS | Status: DC
  Administered 2015-08-22 – 2015-08-23 (×3): via INTRAVENOUS

## 2015-08-22 MED ORDER — FENTANYL CITRATE (PF) 100 MCG/2ML IJ SOLN
100.0000 ug | INTRAMUSCULAR | Status: DC | PRN
  Administered 2015-08-22: 100 ug via INTRAVENOUS
  Filled 2015-08-22: qty 2

## 2015-08-22 MED ORDER — MISOPROSTOL 25 MCG QUARTER TABLET
25.0000 ug | ORAL_TABLET | ORAL | Status: DC | PRN
  Administered 2015-08-22 (×3): 25 ug via VAGINAL
  Filled 2015-08-22 (×4): qty 0.25

## 2015-08-22 MED ORDER — DIPHENHYDRAMINE HCL 50 MG/ML IJ SOLN: 12.5000 mg | INTRAMUSCULAR | Status: DC | PRN

## 2015-08-22 MED ORDER — OXYCODONE-ACETAMINOPHEN 5-325 MG PO TABS: 1.0000 | ORAL_TABLET | ORAL | Status: DC | PRN

## 2015-08-22 MED ORDER — LABETALOL HCL 5 MG/ML IV SOLN: 20.0000 mg | INTRAVENOUS | Status: DC | PRN

## 2015-08-22 NOTE — Progress Notes (Signed)
Miranda Adams is a 24 y.o. G1P0 at [redacted]w[redacted]d here for induction of labor for preeclampsia without severe features.    Subjective:  Patient comfortable.   Objective: BP (!) 145/94   Pulse 86   Temp 98.2 F (36.8 C) (Oral)   Resp 16   Ht 5\' 1"  (1.549 m)   Wt 86.9 kg (191 lb 9.6 oz)   SpO2 100%   BMI 36.20 kg/m  No intake/output data recorded. No intake/output data recorded.  Vitals:   08/22/15 0901 08/22/15 1027 08/22/15 1126 08/22/15 1201  BP: (!) 149/92 (!) 145/92 (!) 139/95 (!) 145/94  Pulse: 84 83 86 86  Resp: 16 17 16 16   Temp:   98.2 F (36.8 C)   TempSrc:   Oral   SpO2:      Weight:      Height:        FHT:  FHR: 150 bpm, variability: moderate,  accelerations:  Present,  decelerations:  Absent UC:   regular, every 1 to 3 minutes SVE:   Dilation: Fingertip Effacement (%): Thick Station: -2 Exam by:: [employer] rn   Updated Cervix check: Fingertip/Thick/-2.  Foley bulb placed through cervix at 1.40pm,  instilled with 50cc NS.      Labs: Lab Results  Component Value Date   WBC 10.6 (H) 08/22/2015   HGB 10.5 (L) 08/22/2015   HCT 30.3 (L) 08/22/2015   MCV 91.3 08/22/2015   PLT 231 08/22/2015   CMP     Component Value Date/Time   NA 135 08/22/2015 0001   K 3.9 08/22/2015 0001   CL 105 08/22/2015 0001   CO2 24 08/22/2015 0001   GLUCOSE 110 (H) 08/22/2015 0001   BUN 9 08/22/2015 0001   CREATININE 0.63 08/22/2015 0001   CALCIUM 8.8 (L) 08/22/2015 0001   PROT 6.3 (L) 08/22/2015 0001   ALBUMIN 2.7 (L) 08/22/2015 0001   AST 22 08/22/2015 0001   ALT 22 08/22/2015 0001   ALKPHOS 220 (H) 08/22/2015 0001   BILITOT 0.5 08/22/2015 0001   GFRNONAA >60 08/22/2015 0001   GFRAA >60 08/22/2015 0001    Assessment / Plan: Induction of labor due to preeclampsia without severe features, s/p cytotec X 3  Labor: Induction progressing well, now with foley bulb Preeclampsia:  no signs or symptoms of toxicity and labs stable Fetal Wellbeing:  Category I Pain  Control:  Pain medication per patient request. Anticipated MOD:  NSVD  Jimi Giza WAKURU 08/22/2015, 12:41 PM

## 2015-08-22 NOTE — Progress Notes (Addendum)
Subjective: Now on [place] for initiation of induction.  Denies HA, visual sx, or epigastric pain.  Objective: BP (!) 151/97   Pulse 92   Temp 98.5 F (36.9 C) (Oral)   Resp 16   Ht 5\' 1"  (1.549 m)   Wt 86.9 kg (191 lb 9.6 oz)   SpO2 100%   BMI 36.20 kg/m  No intake/output data recorded. No intake/output data recorded.  FHT: Category 1 UC:   Occasional, mild. SVE:    Closed, long, vtx, -2.   Assessment:  IUP at 37 weeks Pre-eclampsia without severe features GBS negative  Plan: Start induction with Cytotech, then foley/pit/AROM as appropriate, per consult with Dr. Nelda Marseille. Continue Procardia XL 30 mg daily. IV antihypertensives prn Magnesium sulfate with any severe features, and for 24 hours PP per Dr. Nelda Marseille. [place] labs drawn and pending.  T&S updated today. Pain med/epidural prn.  Donnel Saxon CNM 08/22/2015, 12:40 AM   Addendum: Cytotech 25 mcg placed in posterior fornix. Re-evaluate in 4 hours or prn.  Results for orders placed or performed during the hospital encounter of 08/13/15 (from the past 24 hour(s))  Type and screen [place]     Status: None   Collection Time: 08/21/15  6:17 PM  Result Value Ref Range   ABO/RH(D) B POS    Antibody Screen NEG    Sample Expiration 08/24/2015   Group B strep by PCR     Status: None   Collection Time: 08/21/15  8:42 PM  Result Value Ref Range   Group B strep by PCR NEGATIVE NEGATIVE  CBC     Status: Abnormal   Collection Time: 08/22/15 12:01 AM  Result Value Ref Range   WBC 10.6 (H) 4.0 - 10.5 K/uL   RBC 3.32 (L) 3.87 - 5.11 MIL/uL   Hemoglobin 10.5 (L) 12.0 - 15.0 g/dL   HCT 30.3 (L) 36.0 - 46.0 %   MCV 91.3 78.0 - 100.0 fL   MCH 31.6 26.0 - 34.0 pg   MCHC 34.7 30.0 - 36.0 g/dL   RDW 12.8 11.5 - 15.5 %   Platelets 231 150 - 400 K/uL  Comprehensive metabolic panel     Status: Abnormal   Collection Time: 08/22/15 12:01 AM  Result Value Ref Range   Sodium 135 135 - 145 mmol/L   Potassium 3.9 3.5 - 5.1 mmol/L   Chloride 105 101 - 111 mmol/L   CO2 24 22 - 32 mmol/L   Glucose, Bld 110 (H) 65 - 99 mg/dL   BUN 9 6 - 20 mg/dL   Creatinine, Ser 0.63 0.44 - 1.00 mg/dL   Calcium 8.8 (L) 8.9 - 10.3 mg/dL   Total Protein 6.3 (L) 6.5 - 8.1 g/dL   Albumin 2.7 (L) 3.5 - 5.0 g/dL   AST 22 15 - 41 U/L   ALT 22 14 - 54 U/L   Alkaline Phosphatase 220 (H) 38 - 126 U/L   Total Bilirubin 0.5 0.3 - 1.2 mg/dL   GFR calc non Af Amer >60 >60 mL/min   GFR calc Af Amer >60 >60 mL/min   Anion gap 6 5 - 15  Lactate dehydrogenase     Status: None   Collection Time: 08/22/15 12:01 AM  Result Value Ref Range   LDH 180 98 - 192 U/L  Uric acid     Status: None   Collection Time: 08/22/15 12:01 AM  Result Value Ref Range   Uric Acid, Serum 5.6 2.3 - 6.6 mg/dL  Donnel Saxon, CNM 08/22/15 1a

## 2015-08-22 NOTE — Anesthesia Pain Management Evaluation Note (Signed)
  CRNA Pain Management Visit Note  Patient: Miranda Adams, 25 y.o., female  "Hello I am a member of the anesthesia team at [facility]. We have an anesthesia team available at all times to provide care throughout the hospital, including epidural management and anesthesia for C-section. I don't know your plan for the delivery whether it a natural birth, water birth, IV sedation, nitrous supplementation, doula or epidural, but we want to meet your pain goals."   1.Was your pain managed to your expectations on prior hospitalizations?   Unable to assess - patient sleeping  2.What is your expectation for pain management during this hospitalization?     Patient asleep 3.How can we help you reach that goal? sleeping  Record the patient's initial score and the patient's pain goal.   Pain: unable to assess, patient sleeping  Pain Goal: Patient sleeping - unable to assess [facility] wants you to be able to say your pain was always managed very well.  [facility] 08/22/2015

## 2015-08-22 NOTE — Progress Notes (Addendum)
  Subjective: Comfortable with epidural. Parents and female friend at bedside. Denies HA, visual sx, or epigastric pain.  Objective: BP (!) 145/86   Pulse (!) 106   Temp 98.6 F (37 C) (Oral)   Resp 18   Ht 5\' 1"  (1.549 m)   Wt 86.9 kg (191 lb 9.6 oz)   SpO2 100%   BMI 36.20 kg/m  I/O last 3 completed shifts: In: -  Out: 250 [Urine:250] No intake/output data recorded.   Vitals:   08/22/15 2101 08/22/15 2103 08/22/15 2130 08/22/15 2135  BP: (!) 146/108 (!) 154/98 (!) 156/113 (!) 145/86  Pulse: (!) 104 100 (!) 104 (!) 106  Resp:      Temp:   98.6 F (37 C)   TempSrc:   Oral   SpO2:      Weight:      Height:      Sporadic shaking, likely impacting BP No antihypertensives yet.  FHT: Category 2 at present--decreased variability, no decels, baseline 150-160 UC:   irregular, every 2-5 minutes SVE:   Dilation: 4 Effacement (%): 70, 80 Station: -2 Exam by:: Briggitte Boline,cnm at 2100 AROM at 2100--clear fluid FSE and IUPC placed without difficulty MVUs 175-180 at present.  Assessment:  Induction for pre-eclampsia without severe features GBS negative S/p Cytotech x 2, foley bulb extruded 7pm  Plan: Observe contractions at present, recheck cervix at 11pm. Augment prn. Treat with antihypertensives if BP persistently meets parameters. Dr. Simona Huh updated. Position to facilitate rotation/descent, O2 prn.  Donnel Saxon CNM 08/22/2015, 10:04 PM

## 2015-08-22 NOTE — Progress Notes (Signed)
  Subjective: Has been sleeping--awakened for exam.  Female friend staying with her overnight.  Patient had "facetimed" with FOB.  Denies HA, visual sx, or epigastric pain.  Objective: BP 137/90   Pulse 86   Temp 98 F (36.7 C) (Oral)   Resp 18   Ht 5\' 1"  (1.549 m)   Wt 86.9 kg (191 lb 9.6 oz)   SpO2 100%   BMI 36.20 kg/m  No intake/output data recorded. No intake/output data recorded.   Vitals:   08/22/15 0102 08/22/15 0201 08/22/15 0301 08/22/15 0401  BP: (!) 146/91 (!) 141/91 (!) 143/96 137/90  Pulse: 89 92 88 86  Resp:      Temp:      TempSrc:      SpO2:      Weight:      Height:        FHT: Category 1 UC:   Occasional, mild SVE:   Dilation: Closed Effacement (%): Thick Station: -2, -1 Exam by:: v.Shannia Jacuinde,cnm Cytotech #2 placed in posterior fornix  Assessment:  Induction for pre-eclampsia without severe features GBS negative Unfavorable cervix  Plan: Continue current care Re-evaluate at 9am or prn. Procardia 30 mg XL at 10am. IV antihypertensives prn.  Donnel Saxon CNM 08/22/2015, 5:01 AM

## 2015-08-22 NOTE — Anesthesia Pain Management Evaluation Note (Signed)
  CRNA Pain Management Visit Note  Patient: Miranda Adams, 25 y.o., female  "Hello I am a member of the anesthesia team at [facility]. We have an anesthesia team available at all times to provide care throughout the hospital, including epidural management and anesthesia for C-section. I don't know your plan for the delivery whether it a natural birth, water birth, IV sedation, nitrous supplementation, doula or epidural, but we want to meet your pain goals."   1.Was your pain managed to your expectations on prior hospitalizations?  No prior hospitilizations  2.What is your expectation for pain management during this hospitalization?     Epidural and IV pain meds  3.How can we help you reach that goal? Nursing support, IV meds, possible epidural  Record the patient's initial score and the patient's pain goal.   Pain: 2  Pain Goal: 6 [facility] wants you to be able to say your pain was always managed very well.  [facility] 08/22/2015

## 2015-08-22 NOTE — Progress Notes (Signed)
  Subjective: Comfortable with epidural.  Objective: BP (!) 147/88   Pulse (!) 111   Temp 99.4 F (37.4 C) (Oral)   Resp 18   Ht 5\' 1"  (1.549 m)   Wt 86.9 kg (191 lb 9.6 oz)   SpO2 100%   BMI 36.20 kg/m  I/O last 3 completed shifts: In: -  Out: 250 [Urine:250] No intake/output data recorded.   Vitals:   08/22/15 2201 08/22/15 2231 08/22/15 2301 08/22/15 2315  BP: (!) 147/99 (!) 146/97 (!) 147/88   Pulse: (!) 105 (!) 105 (!) 111   Resp:      Temp:    99.4 F (37.4 C)  TempSrc:    Oral  SpO2:      Weight:      Height:        FHT: Category 1 at present--some segments of decreased variability, but positive response to scalp stim. UC:   irregular, every 2-6 minutes SVE:   Dilation: 5 Effacement (%): 70, 80 Station: -2 Exam by:: Sheldon Amara,cnm MVUs 175-225, but irregular pattern of UCs  Assessment:  Induction due to pre-eclampsia GBS negative Low grade temp  Plan: Tylenol now Start pit augmentation Position to facilitate rotation/descent. Watch temp.  Donnel Saxon CNM 08/22/2015, 11:20 PM

## 2015-08-22 NOTE — Anesthesia Procedure Notes (Signed)
Epidural Patient location during procedure: OB Start time: 08/22/2015 3:48 PM  Staffing Anesthesiologist: Josephine Igo Performed: anesthesiologist   Preanesthetic Checklist Completed: patient identified, site marked, surgical consent, pre-op evaluation, timeout performed, IV checked, risks and benefits discussed and monitors and equipment checked  Epidural Patient position: sitting Prep: site prepped and draped and DuraPrep Patient monitoring: continuous pulse ox and blood pressure Approach: midline Location: L3-L4 Injection technique: LOR air  Needle:  Needle type: Tuohy  Needle gauge: 17 G Needle length: 9 cm and 9 Needle insertion depth: 6 cm Catheter type: closed end flexible Catheter size: 19 Gauge Catheter at skin depth: 11 cm Test dose: negative and Other  Assessment Events: blood not aspirated, injection not painful, no injection resistance, negative IV test and no paresthesia  Additional Notes Patient identified. Risks and benefits discussed including failed block, incomplete  Pain control, post dural puncture headache, nerve damage, paralysis, blood pressure Changes, nausea, vomiting, reactions to medications-both toxic and allergic and post Partum back pain. All questions were answered. Patient expressed understanding and wished to proceed. Sterile technique was used throughout procedure. Epidural site was Dressed with sterile barrier dressing. No paresthesias, signs of intravascular injection Or signs of intrathecal spread were encountered.  Patient was more comfortable after the epidural was dosed. Please see RN's note for documentation of vital signs and FHR which are stable.

## 2015-08-22 NOTE — Anesthesia Preprocedure Evaluation (Addendum)
Anesthesia Evaluation  Patient identified by MRN, date of birth, ID band Patient awake    Reviewed: Allergy & Precautions, H&P , Patient's Chart, lab work & pertinent test results  Airway Mallampati: III  TM Distance: >3 FB Neck ROM: full    Dental no notable dental hx. (+) Teeth Intact   Pulmonary neg pulmonary ROS, Current Smoker,    Pulmonary exam normal breath sounds clear to auscultation       Cardiovascular negative cardio ROS Normal cardiovascular exam Rhythm:regular Rate:Normal     Neuro/Psych negative neurological ROS  negative psych ROS   GI/Hepatic negative GI ROS, Neg liver ROS,   Endo/Other  negative endocrine ROS  Renal/GU negative Renal ROS  negative genitourinary   Musculoskeletal   Abdominal   Peds  Hematology negative hematology ROS (+)   Anesthesia Other Findings   Reproductive/Obstetrics (+) Pregnancy                             Anesthesia Physical Anesthesia Plan  ASA: II  Anesthesia Plan: Epidural   Post-op Pain Management:    Induction:   Airway Management Planned:   Additional Equipment:   Intra-op Plan:   Post-operative Plan:   Informed Consent: I have reviewed the patients History and Physical, chart, labs and discussed the procedure including the risks, benefits and alternatives for the proposed anesthesia with the patient or authorized representative who has indicated his/her understanding and acceptance.     Plan Discussed with: CRNA and Surgeon  Anesthesia Plan Comments: (For C/S with labor epidural)       Anesthesia Quick Evaluation

## 2015-08-23 ENCOUNTER — Encounter (HOSPITAL_COMMUNITY)
Admission: AD | Disposition: A | Discharge status: Home / Self Care | Payer: Self-pay | Source: Ambulatory Visit | Attending: Obstetrics and Gynecology

## 2015-08-23 ENCOUNTER — Encounter (HOSPITAL_COMMUNITY): Payer: Self-pay | Admitting: *Deleted

## 2015-08-23 DIAGNOSIS — Z98891 History of uterine scar from previous surgery: Secondary | ICD-10-CM

## 2015-08-23 LAB — CBC
HCT: 26.3 % — ABNORMAL LOW (ref 36.0–46.0)
HEMATOCRIT: 26.8 % — AB (ref 36.0–46.0)
Hemoglobin: 9.1 g/dL — ABNORMAL LOW (ref 12.0–15.0)
Hemoglobin: 9.1 g/dL — ABNORMAL LOW (ref 12.0–15.0)
MCH: 30.5 pg (ref 26.0–34.0)
MCH: 30.8 pg (ref 26.0–34.0)
MCHC: 34 g/dL (ref 30.0–36.0)
MCHC: 34.6 g/dL (ref 30.0–36.0)
MCV: 89.9 fL (ref 78.0–100.0)
MCV: 89.2 fL (ref 78.0–100.0)
PLATELETS: 212 10*3/uL (ref 150–400)
Platelets: 192 10*3/uL (ref 150–400)
RBC: 2.98 MIL/uL — ABNORMAL LOW (ref 3.87–5.11)
RBC: 2.95 MIL/uL — ABNORMAL LOW (ref 3.87–5.11)
RDW: 13.4 % (ref 11.5–15.5)
RDW: 13.5 % (ref 11.5–15.5)
WBC: 16 10*3/uL — ABNORMAL HIGH (ref 4.0–10.5)
WBC: 18.9 10*3/uL — ABNORMAL HIGH (ref 4.0–10.5)

## 2015-08-23 LAB — COMPREHENSIVE METABOLIC PANEL
ALT: 19 U/L (ref 14–54)
AST: 24 U/L (ref 15–41)
Albumin: 2.4 g/dL — ABNORMAL LOW (ref 3.5–5.0)
Alkaline Phosphatase: 190 U/L — ABNORMAL HIGH (ref 38–126)
Anion gap: 8 (ref 5–15)
BUN: 5 mg/dL — ABNORMAL LOW (ref 6–20)
CHLORIDE: 105 mmol/L (ref 101–111)
CO2: 20 mmol/L — ABNORMAL LOW (ref 22–32)
CREATININE: 0.58 mg/dL (ref 0.44–1.00)
Calcium: 8.4 mg/dL — ABNORMAL LOW (ref 8.9–10.3)
Glucose, Bld: 88 mg/dL (ref 65–99)
POTASSIUM: 4.1 mmol/L (ref 3.5–5.1)
Sodium: 133 mmol/L — ABNORMAL LOW (ref 135–145)
Total Bilirubin: 0.5 mg/dL (ref 0.3–1.2)
Total Protein: 5.5 g/dL — ABNORMAL LOW (ref 6.5–8.1)

## 2015-08-23 LAB — MAGNESIUM: MAGNESIUM: 3.2 mg/dL — AB (ref 1.7–2.4)

## 2015-08-23 LAB — HEPATITIS B SURFACE ANTIGEN: HEP B S AG: NEGATIVE

## 2015-08-23 SURGERY — Surgical Case
Anesthesia: Epidural

## 2015-08-23 MED ORDER — SIMETHICONE 80 MG PO CHEW
80.0000 mg | CHEWABLE_TABLET | ORAL | Status: DC
  Administered 2015-08-24 – 2015-08-26 (×3): 80 mg via ORAL
  Filled 2015-08-23 (×2): qty 1

## 2015-08-23 MED ORDER — ONDANSETRON HCL 4 MG/2ML IJ SOLN: 4.0000 mg | Freq: Three times a day (TID) | INTRAMUSCULAR | Status: DC | PRN

## 2015-08-23 MED ORDER — ONDANSETRON HCL 4 MG/2ML IJ SOLN
INTRAMUSCULAR | Status: AC
  Filled 2015-08-23: qty 4

## 2015-08-23 MED ORDER — NALBUPHINE HCL 10 MG/ML IJ SOLN: 5.0000 mg | INTRAMUSCULAR | Status: DC | PRN

## 2015-08-23 MED ORDER — DIPHENHYDRAMINE HCL 25 MG PO CAPS: 25.0000 mg | ORAL_CAPSULE | ORAL | Status: DC | PRN

## 2015-08-23 MED ORDER — SENNOSIDES-DOCUSATE SODIUM 8.6-50 MG PO TABS
2.0000 | ORAL_TABLET | ORAL | Status: DC
  Administered 2015-08-24 – 2015-08-25 (×2): 2 via ORAL
  Filled 2015-08-23 (×2): qty 2

## 2015-08-23 MED ORDER — MORPHINE SULFATE (PF) 0.5 MG/ML IJ SOLN
INTRAMUSCULAR | Status: AC
  Filled 2015-08-23: qty 10

## 2015-08-23 MED ORDER — NALBUPHINE HCL 10 MG/ML IJ SOLN
5.0000 mg | INTRAMUSCULAR | Status: DC | PRN
Start: 2015-08-23 — End: 2015-08-26

## 2015-08-23 MED ORDER — HYDROMORPHONE HCL 1 MG/ML IJ SOLN
INTRAMUSCULAR | Status: DC | PRN
  Administered 2015-08-23: 1 mg via INTRAVENOUS

## 2015-08-23 MED ORDER — MAGNESIUM SULFATE 50 % IJ SOLN
2.0000 g/h | INTRAMUSCULAR | Status: DC
  Administered 2015-08-23 – 2015-08-24 (×2): 2 g/h via INTRAVENOUS
  Filled 2015-08-23 (×2): qty 80

## 2015-08-23 MED ORDER — NALBUPHINE HCL 10 MG/ML IJ SOLN: 5.0000 mg | Freq: Once | INTRAMUSCULAR | Status: DC | PRN

## 2015-08-23 MED ORDER — KETOROLAC TROMETHAMINE 30 MG/ML IJ SOLN
30.0000 mg | Freq: Four times a day (QID) | INTRAMUSCULAR | Status: AC | PRN
  Filled 2015-08-23: qty 1

## 2015-08-23 MED ORDER — COCONUT OIL OIL
1.0000 "application " | TOPICAL_OIL | Status: DC | PRN
  Filled 2015-08-23: qty 120

## 2015-08-23 MED ORDER — SIMETHICONE 80 MG PO CHEW
80.0000 mg | CHEWABLE_TABLET | Freq: Three times a day (TID) | ORAL | Status: DC
  Administered 2015-08-23 – 2015-08-25 (×9): 80 mg via ORAL
  Filled 2015-08-23 (×10): qty 1

## 2015-08-23 MED ORDER — ACETAMINOPHEN 325 MG PO TABS: 650.0000 mg | ORAL_TABLET | ORAL | Status: DC | PRN

## 2015-08-23 MED ORDER — HYDROMORPHONE HCL 1 MG/ML IJ SOLN
INTRAMUSCULAR | Status: AC
  Filled 2015-08-23: qty 1

## 2015-08-23 MED ORDER — LIDOCAINE-EPINEPHRINE (PF) 2 %-1:200000 IJ SOLN
INTRAMUSCULAR | Status: AC
  Filled 2015-08-23: qty 20

## 2015-08-23 MED ORDER — OXYTOCIN 10 UNIT/ML IJ SOLN
INTRAVENOUS | Status: DC | PRN
  Administered 2015-08-23: 40 [IU] via INTRAVENOUS

## 2015-08-23 MED ORDER — NALOXONE HCL 0.4 MG/ML IJ SOLN: 0.4000 mg | INTRAMUSCULAR | Status: DC | PRN

## 2015-08-23 MED ORDER — IBUPROFEN 600 MG PO TABS
600.0000 mg | ORAL_TABLET | Freq: Four times a day (QID) | ORAL | Status: DC
  Administered 2015-08-23 – 2015-08-26 (×13): 600 mg via ORAL
  Filled 2015-08-23 (×13): qty 1

## 2015-08-23 MED ORDER — TETANUS-DIPHTH-ACELL PERTUSSIS 5-2.5-18.5 LF-MCG/0.5 IM SUSP
0.5000 mL | Freq: Once | INTRAMUSCULAR | Status: DC
  Filled 2015-08-23: qty 0.5

## 2015-08-23 MED ORDER — MAGNESIUM SULFATE BOLUS VIA INFUSION
4.0000 g | Freq: Once | INTRAVENOUS | Status: AC
  Administered 2015-08-23: 4 g via INTRAVENOUS
  Filled 2015-08-23: qty 500

## 2015-08-23 MED ORDER — MEASLES, MUMPS & RUBELLA VAC ~~LOC~~ INJ
0.5000 mL | INJECTION | Freq: Once | SUBCUTANEOUS | Status: DC
  Filled 2015-08-23: qty 0.5

## 2015-08-23 MED ORDER — WITCH HAZEL-GLYCERIN EX PADS: 1.0000 "application " | MEDICATED_PAD | CUTANEOUS | Status: DC | PRN

## 2015-08-23 MED ORDER — CEFAZOLIN SODIUM-DEXTROSE 2-4 GM/100ML-% IV SOLN
2.0000 g | Freq: Once | INTRAVENOUS | Status: AC
Start: 2015-08-23 — End: 2015-08-23
  Administered 2015-08-23: 2 g via INTRAVENOUS

## 2015-08-23 MED ORDER — LACTATED RINGERS IV SOLN
INTRAVENOUS | Status: DC
  Administered 2015-08-23 (×2): via INTRAVENOUS

## 2015-08-23 MED ORDER — SCOPOLAMINE 1 MG/3DAYS TD PT72
MEDICATED_PATCH | TRANSDERMAL | Status: DC | PRN
  Administered 2015-08-23: 1 via TRANSDERMAL

## 2015-08-23 MED ORDER — NALOXONE HCL 2 MG/2ML IJ SOSY
1.0000 ug/kg/h | PREFILLED_SYRINGE | INTRAMUSCULAR | Status: DC | PRN
Start: 2015-08-23 — End: 2015-08-26
  Filled 2015-08-23: qty 2

## 2015-08-23 MED ORDER — OXYCODONE-ACETAMINOPHEN 5-325 MG PO TABS: 1.0000 | ORAL_TABLET | ORAL | Status: DC | PRN

## 2015-08-23 MED ORDER — OXYCODONE-ACETAMINOPHEN 5-325 MG PO TABS: 2.0000 | ORAL_TABLET | ORAL | Status: DC | PRN

## 2015-08-23 MED ORDER — DEXAMETHASONE SODIUM PHOSPHATE 4 MG/ML IJ SOLN
INTRAMUSCULAR | Status: AC
  Filled 2015-08-23: qty 1

## 2015-08-23 MED ORDER — IBUPROFEN 600 MG PO TABS: 600.0000 mg | ORAL_TABLET | Freq: Four times a day (QID) | ORAL | Status: DC | PRN

## 2015-08-23 MED ORDER — LACTATED RINGERS IV SOLN
INTRAVENOUS | Status: DC
  Administered 2015-08-23: 02:00:00 via INTRAUTERINE

## 2015-08-23 MED ORDER — DEXAMETHASONE SODIUM PHOSPHATE 4 MG/ML IJ SOLN
INTRAMUSCULAR | Status: DC | PRN
  Administered 2015-08-23: 4 mg via INTRAVENOUS

## 2015-08-23 MED ORDER — OXYTOCIN 10 UNIT/ML IJ SOLN
INTRAMUSCULAR | Status: AC
  Filled 2015-08-23: qty 4

## 2015-08-23 MED ORDER — ACETAMINOPHEN 500 MG PO TABS
1000.0000 mg | ORAL_TABLET | Freq: Four times a day (QID) | ORAL | Status: AC
  Administered 2015-08-23 – 2015-08-24 (×4): 1000 mg via ORAL
  Filled 2015-08-23 (×4): qty 2

## 2015-08-23 MED ORDER — MORPHINE SULFATE (PF) 0.5 MG/ML IJ SOLN
INTRAMUSCULAR | Status: DC | PRN
  Administered 2015-08-23: 3 mg via EPIDURAL

## 2015-08-23 MED ORDER — HYDRALAZINE HCL 20 MG/ML IJ SOLN: 5.0000 mg | INTRAMUSCULAR | Status: DC | PRN

## 2015-08-23 MED ORDER — DIPHENHYDRAMINE HCL 50 MG/ML IJ SOLN: 12.5000 mg | INTRAMUSCULAR | Status: DC | PRN

## 2015-08-23 MED ORDER — SCOPOLAMINE 1 MG/3DAYS TD PT72
1.0000 | MEDICATED_PATCH | Freq: Once | TRANSDERMAL | Status: AC
  Administered 2015-08-23: 1.5 mg via TRANSDERMAL
  Filled 2015-08-23: qty 1

## 2015-08-23 MED ORDER — MENTHOL 3 MG MT LOZG
1.0000 | LOZENGE | OROMUCOSAL | Status: DC | PRN
  Filled 2015-08-23: qty 9

## 2015-08-23 MED ORDER — SODIUM CHLORIDE 0.9% FLUSH: 3.0000 mL | INTRAVENOUS | Status: DC | PRN

## 2015-08-23 MED ORDER — DIPHENHYDRAMINE HCL 25 MG PO CAPS: 25.0000 mg | ORAL_CAPSULE | Freq: Four times a day (QID) | ORAL | Status: DC | PRN

## 2015-08-23 MED ORDER — HYDROMORPHONE HCL 1 MG/ML IJ SOLN: 1.0000 mg | Freq: Once | INTRAMUSCULAR | Status: DC

## 2015-08-23 MED ORDER — LABETALOL HCL 5 MG/ML IV SOLN: 20.0000 mg | INTRAVENOUS | Status: DC | PRN

## 2015-08-23 MED ORDER — PHENYLEPHRINE HCL 10 MG/ML IJ SOLN
INTRAMUSCULAR | Status: DC | PRN
  Administered 2015-08-23: 80 ug via INTRAVENOUS
  Administered 2015-08-23: 40 ug via INTRAVENOUS
  Administered 2015-08-23: 80 ug via INTRAVENOUS

## 2015-08-23 MED ORDER — MAGNESIUM HYDROXIDE 400 MG/5ML PO SUSP: 30.0000 mL | ORAL | Status: DC | PRN

## 2015-08-23 MED ORDER — DIBUCAINE 1 % RE OINT
1.0000 "application " | TOPICAL_OINTMENT | RECTAL | Status: DC | PRN
  Filled 2015-08-23: qty 28.4

## 2015-08-23 MED ORDER — HYDROMORPHONE HCL 1 MG/ML IJ SOLN
INTRAMUSCULAR | Status: AC
  Administered 2015-08-23: 1 mg
  Filled 2015-08-23: qty 1

## 2015-08-23 MED ORDER — SODIUM CHLORIDE 0.9 % IR SOLN
Status: DC | PRN
  Administered 2015-08-23: 1000 mL

## 2015-08-23 MED ORDER — SCOPOLAMINE 1 MG/3DAYS TD PT72
MEDICATED_PATCH | TRANSDERMAL | Status: AC
Start: 2015-08-23 — End: 2015-08-23
  Filled 2015-08-23: qty 1

## 2015-08-23 MED ORDER — PRENATAL MULTIVITAMIN CH
1.0000 | ORAL_TABLET | Freq: Every day | ORAL | Status: DC
  Administered 2015-08-23 – 2015-08-26 (×4): 1 via ORAL
  Filled 2015-08-23 (×4): qty 1

## 2015-08-23 MED ORDER — LIDOCAINE-EPINEPHRINE (PF) 2 %-1:200000 IJ SOLN
INTRAMUSCULAR | Status: DC | PRN
  Administered 2015-08-23 (×3): 5 mL

## 2015-08-23 MED ORDER — SIMETHICONE 80 MG PO CHEW: 80.0000 mg | CHEWABLE_TABLET | ORAL | Status: DC | PRN

## 2015-08-23 MED ORDER — OXYTOCIN 40 UNITS IN LACTATED RINGERS INFUSION - SIMPLE MED: 2.5000 [IU]/h | INTRAVENOUS | Status: AC

## 2015-08-23 SURGICAL SUPPLY — 37 items
BARRIER ADHS 3X4 INTERCEED (GAUZE/BANDAGES/DRESSINGS) ×6 IMPLANT
BENZOIN TINCTURE PRP APPL 2/3 (GAUZE/BANDAGES/DRESSINGS) ×3 IMPLANT
CHLORAPREP W/TINT 26ML (MISCELLANEOUS) ×3 IMPLANT
CLAMP CORD UMBIL (MISCELLANEOUS) IMPLANT
CLOSURE STERI STRIP 1/2 X4 (GAUZE/BANDAGES/DRESSINGS) ×3 IMPLANT
CLOSURE WOUND 1/2 X4 (GAUZE/BANDAGES/DRESSINGS)
CLOTH BEACON ORANGE TIMEOUT ST (SAFETY) ×3 IMPLANT
DRSG OPSITE POSTOP 4X10 (GAUZE/BANDAGES/DRESSINGS) ×3 IMPLANT
ELECT REM PT RETURN 9FT ADLT (ELECTROSURGICAL) ×3
ELECTRODE REM PT RTRN 9FT ADLT (ELECTROSURGICAL) ×1 IMPLANT
EXTRACTOR VACUUM BELL STYLE (SUCTIONS) IMPLANT
GLOVE BIO SURGEON STRL SZ7 (GLOVE) ×3 IMPLANT
GLOVE BIOGEL PI IND STRL 7.0 (GLOVE) ×2 IMPLANT
GLOVE BIOGEL PI INDICATOR 7.0 (GLOVE) ×4
GOWN STRL REUS W/TWL LRG LVL3 (GOWN DISPOSABLE) ×6 IMPLANT
KIT ABG SYR 3ML LUER SLIP (SYRINGE) IMPLANT
LIQUID BAND (GAUZE/BANDAGES/DRESSINGS) IMPLANT
NEEDLE HYPO 25X5/8 SAFETYGLIDE (NEEDLE) IMPLANT
NS IRRIG 1000ML POUR BTL (IV SOLUTION) ×3 IMPLANT
PACK C SECTION WH (CUSTOM PROCEDURE TRAY) ×3 IMPLANT
PAD ABD 7.5X8 STRL (GAUZE/BANDAGES/DRESSINGS) ×3 IMPLANT
PAD OB MATERNITY 4.3X12.25 (PERSONAL CARE ITEMS) ×3 IMPLANT
PENCIL SMOKE EVAC W/HOLSTER (ELECTROSURGICAL) ×3 IMPLANT
RTRCTR C-SECT PINK 25CM LRG (MISCELLANEOUS) ×3 IMPLANT
SPONGE GAUZE 4X4 12PLY STER LF (GAUZE/BANDAGES/DRESSINGS) ×6 IMPLANT
STRIP CLOSURE SKIN 1/2X4 (GAUZE/BANDAGES/DRESSINGS) IMPLANT
SUT CHROMIC 0 CTX 36 (SUTURE) IMPLANT
SUT MON AB 4-0 PS1 27 (SUTURE) ×3 IMPLANT
SUT PLAIN 0 NONE (SUTURE) IMPLANT
SUT PLAIN 2 0 XLH (SUTURE) ×3 IMPLANT
SUT VIC AB 0 CTX 36 (SUTURE) ×10
SUT VIC AB 0 CTX36XBRD ANBCTRL (SUTURE) ×5 IMPLANT
SUT VIC AB 2-0 CT1 (SUTURE) ×3 IMPLANT
SUT VIC AB 2-0 CT1 27 (SUTURE) ×2
SUT VIC AB 2-0 CT1 TAPERPNT 27 (SUTURE) ×1 IMPLANT
TOWEL OR 17X24 6PK STRL BLUE (TOWEL DISPOSABLE) ×3 IMPLANT
TRAY FOLEY CATH SILVER 14FR (SET/KITS/TRAYS/PACK) IMPLANT

## 2015-08-23 NOTE — Progress Notes (Signed)
  Subjective: Sleeping on right side.  Objective: BP (!) 145/83   Pulse (!) 106   Temp 99.4 F (37.4 C) (Oral)   Resp 18   Ht 5\' 1"  (1.549 m)   Wt 86.9 kg (191 lb 9.6 oz)   SpO2 100%   BMI 36.20 kg/m  I/O last 3 completed shifts: In: -  Out: 250 [Urine:250] No intake/output data recorded.  FHT: Category 1 UC:   regular, every 2 minutes SVE:   Dilation: 5 Effacement (%): 70, 80 Station: -2 Exam by:: Jos Cygan,cnm at 2330 Pitocin off at 0114, O2 on per face mask. Amnioinfusion initiated. MVUs 250 at present.  Assessment:  Induction for pre-eclampsia Resolution of Category 2/3 to Category 1 Adequate labor  Plan: Continue to monitor. Recheck cervix at 0230 or prn.  Donnel Saxon CNM 08/23/2015, 1:52 AM

## 2015-08-23 NOTE — Brief Op Note (Signed)
08/13/2015 - 08/23/2015  4:15 AM  PATIENT:  Miranda Adams  25 y.o. female  PRE-OPERATIVE DIAGNOSIS:  IUP @ 37 1/7 weeks, Preeclampsia, nonreassuring fetal heart rate  POST-OPERATIVE DIAGNOSIS:  Same, Fibroid  PROCEDURE:  Procedure(s): CESAREAN SECTION (N/A), Primary, LTCS  SURGEON:  Surgeon(s) and Role:    * Thurnell Lose, MD - Primary  PHYSICIAN ASSISTANT:   ASSISTANTS: Donnel Saxon, CNM   ANESTHESIA:   epidural  EBL:  Total I/O In: 500 [I.V.:500] Out: 2600 [Urine:1800; Blood:800]  BLOOD ADMINISTERED:none  DRAINS: Urinary Catheter (Foley)   LOCAL MEDICATIONS USED:  NONE  SPECIMEN:  Source of Specimen:  Placenta  DISPOSITION OF SPECIMEN:  PATHOLOGY  COUNTS:  YES  TOURNIQUET:  * No tourniquets in log *  DICTATION: .Other Dictation: Dictation Number Number not provided, abruptly disconnected  PLAN OF CARE: Transfer to antepartum for Magnesium  PATIENT DISPOSITION:  PACU - hemodynamically stable.   Delay start of Pharmacological VTE agent (>24hrs) due to surgical blood loss or risk of bleeding: yes

## 2015-08-23 NOTE — Progress Notes (Signed)
Patient received Baby and Me book, baby safe sheet and fall sheet executed and in chart.  All questions answered, patient updated and educated on care.

## 2015-08-23 NOTE — Progress Notes (Signed)
Patient ID: Miranda Adams, female   DOB: 11/19/1990, 25 y.o.   MRN: [id number]   Notified by CNM of repetitive late decelerations.  Amnioinfusion seemed to help early but pt appears to hyperstimulate without Pitocin and fetal tracing was nonreassuring.  Upon arrival to 168, fetal tracing reassuring s/p Terbutaline.  Pt counseled on Risks of cesarean section and rationale.  Pt did not have any questions.

## 2015-08-23 NOTE — Progress Notes (Signed)
Spoke with Mickel Baas in Lactation and they will be in to see patient this morning.

## 2015-08-23 NOTE — Addendum Note (Signed)
Addendum  created 08/23/15 0751 by Riki Sheer, CRNA   Charge Capture section accepted, Sign clinical note

## 2015-08-23 NOTE — Consult Note (Signed)
Neonatology Note:   Attendance at C-section:    I was asked by Dr. Suzzette Righter to attend this C/S at term for late decels. The mother is a G1, GBS neg with good prenatal care. Pregnancy complicated by PIH and pre-eclampsia.  ROM 15 hours before delivery, fluid clear. Infant vigorous with good spontaneous cry and tone. Needed only minimal bulb suctioning. Ap 8/9. Lungs clear to ausc in DR. To CN to care of Pediatrician.  Monia Sabal Katherina Mires, MD

## 2015-08-23 NOTE — Transfer of Care (Signed)
Immediate Anesthesia Transfer of Care Note  Patient: Miranda Adams  Procedure(s) Performed: Procedure(s): CESAREAN SECTION (N/A)  Patient Location: PACU  Anesthesia Type:Epidural  Level of Consciousness: awake, alert  and oriented  Airway & Oxygen Therapy: Patient Spontanous Breathing  Post-op Assessment: Report given to RN and Post -op Vital signs reviewed and stable  Post vital signs: Reviewed and stable  Last Vitals:  Vitals:   08/23/15 0201 08/23/15 0231  BP: (!) 144/98 134/73  Pulse: 100 (!) 107  Resp:    Temp:      Last Pain:  Vitals:   08/23/15 0150  TempSrc: Oral  PainSc:       Patients Stated Pain Goal: 5 ([id number])  Complications: No apparent anesthesia complications

## 2015-08-23 NOTE — Lactation Note (Signed)
This note was copied from a baby's chart. Lactation Consultation Note  Patient Name: Miranda Adams [id number] Date: 08/23/2015 Reason for consult: Follow-up assessment;Infant < 6lbs Baby still not showing any interest in latching or sucking on finger.  Mom pumped 3 mls of colostrum which I finger fed to baby.  Baby spit small amount of clear mucus prior to feeding.  Instructed mom to continue skin to skin and watch for feeding cues.  Mom knows to pump every 3 hours.  Encouraged to call with concerns/assist prn.  Maternal Data    Feeding Feeding Type: Breast Fed  LATCH Score/Interventions Latch: Too sleepy or reluctant, no latch achieved, no sucking elicited. Intervention(s): Skin to skin;Teach feeding cues;Waking techniques Intervention(s): Adjust position;Assist with latch;Breast massage;Breast compression  Audible Swallowing: None  Type of Nipple: Everted at rest and after stimulation  Comfort (Breast/Nipple): Soft / non-tender     Hold (Positioning): Assistance needed to correctly position infant at breast and maintain latch. Intervention(s): Breastfeeding basics reviewed;Support Pillows;Position options;Skin to skin  LATCH Score: 5  Lactation Tools Discussed/Used     Consult Status      Ave Filter 08/23/2015, 4:26 PM

## 2015-08-23 NOTE — Lactation Note (Signed)
This note was copied from a baby's chart. Lactation Consultation Note  Patient Name: Miranda Adams [id number] Date: 08/23/2015 Reason for consult: Initial assessment;Infant < 6lbs Initial visit made.  Mom has baby skin to skin on her chest and baby is sleeping.  She states she has had some latch attempts but baby has not latched yet.  Baby is 7 hours old.  Baby woke easily with waking techniques.   Mom can easily express large amounts of colostrum.  Attempted latch in laid back position and baby can latch for a few sucks but not sustain the latch.  Symphony pump set up and initiated.  Mom was able to express 7 mls of colostrum which I finger fed to baby using curved tip syringe.  Baby did fair.  Eliciting suck was difficult at times but baby did finish feeding.  Instructed mom to breastfeed with any feeding cue and also post pump every 3 hours.  Breastfeeding consultation services and support information given and reviewed.  Mom is very tired so teaching kept to a minimum.   Maternal Data Has patient been taught Hand Expression?: Yes Does the patient have breastfeeding experience prior to this delivery?: No  Feeding Feeding Type: Breast Fed Length of feed: 10 min  LATCH Score/Interventions Latch: Repeated attempts needed to sustain latch, nipple held in mouth throughout feeding, stimulation needed to elicit sucking reflex. Intervention(s): Skin to skin;Teach feeding cues;Waking techniques Intervention(s): Breast compression;Breast massage;Assist with latch;Adjust position  Audible Swallowing: None Intervention(s): Skin to skin;Hand expression Intervention(s): Skin to skin;Hand expression;Alternate breast massage  Type of Nipple: Everted at rest and after stimulation  Comfort (Breast/Nipple): Soft / non-tender     Hold (Positioning): Assistance needed to correctly position infant at breast and maintain latch. Intervention(s): Breastfeeding basics reviewed;Support Pillows;Position  options;Skin to skin  LATCH Score: 6  Lactation Tools Discussed/Used Pump Review: Setup, frequency, and cleaning;Milk Storage Initiated by:: [place] Date initiated:: 08/23/15   Consult Status Consult Status: Follow-up Date: 08/24/15 Follow-up type: In-patient    Ave Filter 08/23/2015, 11:13 AM

## 2015-08-23 NOTE — Anesthesia Postprocedure Evaluation (Signed)
Anesthesia Post Note  Patient: Miranda Adams  Procedure(s) Performed: Procedure(s) (LRB): CESAREAN SECTION (N/A)  Patient location during evaluation: Antenatal Anesthesia Type: Epidural Level of consciousness: awake and alert Pain management: pain level controlled Vital Signs Assessment: post-procedure vital signs reviewed and stable Respiratory status: spontaneous breathing, nonlabored ventilation and respiratory function stable Cardiovascular status: stable Postop Assessment: no headache, no backache and epidural receding Anesthetic complications: no     Last Vitals:  Vitals:   08/23/15 0720 08/23/15 0721  BP:  (!) 149/95  Pulse: (!) 117 (!) 116  Resp:  16  Temp:      Last Pain:  Vitals:   08/23/15 0619  TempSrc:   PainSc: 1    Pain Goal: Patients Stated Pain Goal: 5 (08/22/15 0901)               Riki Sheer

## 2015-08-23 NOTE — Anesthesia Postprocedure Evaluation (Signed)
Anesthesia Post Note  Patient: Miranda Adams  Procedure(s) Performed: Procedure(s) (LRB): CESAREAN SECTION (N/A)  Patient location during evaluation: PACU Anesthesia Type: Epidural Level of consciousness: awake Pain management: pain level controlled Respiratory status: spontaneous breathing Cardiovascular status: stable Postop Assessment: no headache, no backache, epidural receding, patient able to bend at knees and no signs of nausea or vomiting Anesthetic complications: no     Last Vitals:  Vitals:   08/23/15 0518 08/23/15 0532  BP: (!) 159/101 (!) 156/101  Pulse: (!) 108 (!) 103  Resp: (!) 29 (!) 27  Temp:  37.4 C    Last Pain:  Vitals:   08/23/15 0532  TempSrc: Oral  PainSc: 7    Pain Goal: Patients Stated Pain Goal: 5 (08/22/15 0901)               Aldora

## 2015-08-23 NOTE — Progress Notes (Signed)
  Subjective: Sleeping on right side.  Objective: BP (!) 145/83   Pulse (!) 106   Temp 99.4 F (37.4 C) (Oral)   Resp 18   Ht 5\' 1"  (1.549 m)   Wt 86.9 kg (191 lb 9.6 oz)   SpO2 100%   BMI 36.20 kg/m  I/O last 3 completed shifts: In: -  Out: 250 [Urine:250] No intake/output data recorded.   Vitals:   08/22/15 2231 08/22/15 2301 08/22/15 2315 08/22/15 2331  BP: (!) 146/97 (!) 147/88  (!) 145/83  Pulse: (!) 105 (!) 111  (!) 106  Resp:      Temp:   99.4 F (37.4 C)   TempSrc:   Oral   SpO2:      Weight:      Height:        FHT: Category 2--20 min of late decels, moderate variability.  Position changed, IV bolus begun, pit d/c'd (on 3 mu/min), O2 on. UC:  q 2-4 min, coupling and tripling SVE:   Dilation: 5 Effacement (%): 70, 80 Station: -2 Exam by:: Briena Swingler,cnm MVUs 200  Assessment:  Induction for pre-eclampsia Category 2 FHR, potential Category 3  Plan: Consulted with Dr. Otilio Saber amnioinfusion, observe s/p d/c of pitocin. Continue intrauterine resuscitation measures, update Dr. Simona Huh prn  Cira Servant, [place] 08/23/2015, 1:20 AM

## 2015-08-23 NOTE — Progress Notes (Signed)
  Subjective: Comfortable.  Objective: BP (!) 144/98   Pulse 100   Temp 98.8 F (37.1 C) (Oral)   Resp 18   Ht 5\' 1"  (1.549 m)   Wt 86.9 kg (191 lb 9.6 oz)   SpO2 100%   BMI 36.20 kg/m  I/O last 3 completed shifts: In: -  Out: 250 [Urine:250] Total I/O In: -  Out: 1600 [Urine:1600]  FHT: Category 2/3--had segment of Category 1 after amnioinfusion initiated, now recurrent lates, moderate variability. UC:   regular, every 2-3 minutes SVE:   Dilation: 5 Effacement (%): 70, 80 Station: -2 Exam by:: Fontella Shan,cnm--no change from previous exam Terbutaline 0.25 mg SQ given at 0219.  Assessment:  Induction for pre-eclampsia without severe features Fetal intolerance to labor GBS negative  Plan: Consulted with Dr. Simona Huh. Will proceed with C/S for fetal intolerance to labor Risks and benefits of cesarean were reviewed with patient and family, including anesthesia, bleeding, infection, and damage to other organs.  Patient and family seem to understand these risks and are in agreement with proceeding with cesarean for fetal indications. Will plan magnesium sulfate PP x 24 hours.  Donnel Saxon CNM 08/23/2015, 2:20 AM

## 2015-08-23 NOTE — Addendum Note (Signed)
Addendum  created 08/23/15 0602 by Lyn Hollingshead, MD   Sign clinical note

## 2015-08-24 LAB — COMPREHENSIVE METABOLIC PANEL
ALT: 17 U/L (ref 14–54)
AST: 19 U/L (ref 15–41)
Albumin: 2.2 g/dL — ABNORMAL LOW (ref 3.5–5.0)
Alkaline Phosphatase: 140 U/L — ABNORMAL HIGH (ref 38–126)
Anion gap: 6 (ref 5–15)
BUN: 6 mg/dL (ref 6–20)
CHLORIDE: 103 mmol/L (ref 101–111)
CO2: 25 mmol/L (ref 22–32)
CREATININE: 0.57 mg/dL (ref 0.44–1.00)
Calcium: 7.1 mg/dL — ABNORMAL LOW (ref 8.9–10.3)
GFR calc non Af Amer: >60 mL/min (ref >60)
Glucose, Bld: 83 mg/dL (ref 65–99)
POTASSIUM: 4.2 mmol/L (ref 3.5–5.1)
SODIUM: 134 mmol/L — AB (ref 135–145)
Total Bilirubin: 0.4 mg/dL (ref 0.3–1.2)
Total Protein: 5.6 g/dL — ABNORMAL LOW (ref 6.5–8.1)

## 2015-08-24 LAB — CBC
HCT: 23.3 % — ABNORMAL LOW (ref 36.0–46.0)
HEMOGLOBIN: 8.1 g/dL — AB (ref 12.0–15.0)
MCH: 31.3 pg (ref 26.0–34.0)
MCHC: 34.8 g/dL (ref 30.0–36.0)
MCV: 90 fL (ref 78.0–100.0)
PLATELETS: 208 10*3/uL (ref 150–400)
RBC: 2.59 MIL/uL — ABNORMAL LOW (ref 3.87–5.11)
RDW: 14.1 % (ref 11.5–15.5)
WBC: 15.3 10*3/uL — ABNORMAL HIGH (ref 4.0–10.5)

## 2015-08-24 LAB — URIC ACID: URIC ACID, SERUM: 5.2 mg/dL (ref 2.3–6.6)

## 2015-08-24 LAB — RUBELLA SCREEN: Rubella: 3.83 index (ref >0.99)

## 2015-08-24 LAB — LACTATE DEHYDROGENASE: LDH: 211 U/L — ABNORMAL HIGH (ref 98–192)

## 2015-08-24 MED ORDER — FERROUS SULFATE 325 (65 FE) MG PO TABS
325.0000 mg | ORAL_TABLET | Freq: Every day | ORAL | Status: DC
  Administered 2015-08-24 – 2015-08-26 (×3): 325 mg via ORAL
  Filled 2015-08-24 (×3): qty 1

## 2015-08-24 MED ORDER — LACTATED RINGERS IV SOLN
2.0000 g/h | INTRAVENOUS | Status: AC
  Filled 2015-08-24: qty 80

## 2015-08-24 MED ORDER — DOCUSATE SODIUM 100 MG PO CAPS
100.0000 mg | ORAL_CAPSULE | Freq: Two times a day (BID) | ORAL | Status: DC
  Administered 2015-08-24 – 2015-08-26 (×5): 100 mg via ORAL
  Filled 2015-08-24 (×5): qty 1

## 2015-08-24 MED ORDER — MEPERIDINE HCL 25 MG/ML IJ SOLN: 6.2500 mg | INTRAMUSCULAR | Status: DC | PRN

## 2015-08-24 MED ORDER — BISACODYL 10 MG RE SUPP
10.0000 mg | Freq: Every day | RECTAL | Status: DC | PRN
  Filled 2015-08-24: qty 1

## 2015-08-24 NOTE — Progress Notes (Signed)
Patient called nurse to room via call light and stated that baby was choking and that she could not get to him. Nurse entered room and patient was sitting up looking at baby with baby grunting and pursing lips. Nurse picked baby up and used bulb syringe to remove saliva from baby's mouth, wiped mouth, attempted to burp baby. Baby spit up again, yellow, moderately thick, pursing and smacking lips and grunting. Bulb syringe used again. Face and neck cleaned. Sitting baby upright with mom when baby nurse entered room for assessment. Advised baby nurse of happenings.

## 2015-08-24 NOTE — Progress Notes (Signed)
In to see patient--doing well, but feels "gassy".  Passing gas occasionally, but feels bloated. Up ad lib, voiding without difficulty.  No syncope or dizziness.  Vitals:   08/24/15 1216 08/24/15 1238 08/24/15 1836 08/24/15 1839  BP:  (!) 141/93  137/84  Pulse:  (!) 107  85  Resp:  18 18   Temp:  98.3 F (36.8 C) 98.7 F (37.1 C)   TempSrc:      SpO2:      Weight: 83.6 kg (184 lb 3.2 oz)     Height:       Results for orders placed or performed during the hospital encounter of 08/13/15 (from the past 24 hour(s))  CBC     Status: Abnormal   Collection Time: 08/24/15  5:54 AM  Result Value Ref Range   WBC 15.3 (H) 4.0 - 10.5 K/uL   RBC 2.59 (L) 3.87 - 5.11 MIL/uL   Hemoglobin 8.1 (L) 12.0 - 15.0 g/dL   HCT 23.3 (L) 36.0 - 46.0 %   MCV 90.0 78.0 - 100.0 fL   MCH 31.3 26.0 - 34.0 pg   MCHC 34.8 30.0 - 36.0 g/dL   RDW 14.1 11.5 - 15.5 %   Platelets 208 150 - 400 K/uL  Comprehensive metabolic panel     Status: Abnormal   Collection Time: 08/24/15  5:54 AM  Result Value Ref Range   Sodium 134 (L) 135 - 145 mmol/L   Potassium 4.2 3.5 - 5.1 mmol/L   Chloride 103 101 - 111 mmol/L   CO2 25 22 - 32 mmol/L   Glucose, Bld 83 65 - 99 mg/dL   BUN 6 6 - 20 mg/dL   Creatinine, Ser 0.57 0.44 - 1.00 mg/dL   Calcium 7.1 (L) 8.9 - 10.3 mg/dL   Total Protein 5.6 (L) 6.5 - 8.1 g/dL   Albumin 2.2 (L) 3.5 - 5.0 g/dL   AST 19 15 - 41 U/L   ALT 17 14 - 54 U/L   Alkaline Phosphatase 140 (H) 38 - 126 U/L   Total Bilirubin 0.4 0.3 - 1.2 mg/dL   GFR calc non Af Amer >60 >60 mL/min   GFR calc Af Amer >60 >60 mL/min   Anion gap 6 5 - 15  Lactate dehydrogenase     Status: Abnormal   Collection Time: 08/24/15  5:54 AM  Result Value Ref Range   LDH 211 (H) 98 - 192 U/L  Uric acid     Status: None   Collection Time: 08/24/15  5:54 AM  Result Value Ref Range   Uric Acid, Serum 5.2 2.3 - 6.6 mg/dL    Chest clear Heart RRR without murmur Abd soft, NT, mild tympanic distension. Fundus firm Lochia  scant Ext WNL  Assessment/Plan: Day 1 post-op C/S--NRFHR Pre-eclampsia without severe features  Continue current care Prune juice/apple juice cocktail Dulcolax supp prn.  Donnel Saxon, CNM 08/24/15 7:30p

## 2015-08-24 NOTE — Op Note (Signed)
NAMELOANY, CARMACK           ACCOUNT NO.:  [id number]  MEDICAL RECORD NO.:  [id number]  LOCATION:                                 FACILITY:  PHYSICIAN:  Jola Schmidt, MD   DATE OF BIRTH:  06-26-1990  DATE OF PROCEDURE:  08/23/2015 DATE OF DISCHARGE:                              OPERATIVE REPORT   PREOP DIAGNOSIS:  IUP at 25 and 1/7th weeks.  Preeclampsia and nonreassuring fetal heart rate.  POSTOP DIAGNOSIS:  IUP at 24 and 1/7th weeks.  Preeclampsia and nonreassuring fetal heart rate, fibroid.  PROCEDURE:  Primary low transverse cesarean section via 2 layer closure.  SURGEON:  Jola Schmidt, MD  ASSISTANT:  Cathlean [place]se[place]. Cira Servant, C.N.M.  ANESTHESIA:  Epidural.  EBL:  800.  URINE:  1800 mL out.  BLOOD ADMINISTERED:  None.  DRAINS:  Foley catheter.  Local none.  SPECIMEN:  Placenta to Pathology.  DISPOSITION:  To PACU hemodynamically stable.  COMPLICATIONS:  None.  FINDINGS:  Viable female infant in the vertex position.  Apgars 8 and 9. Nuchal cord x1, a transverse position.  He was ROT.  A 3 cm fibroid in the lower uterine segment intramural and normal ovaries and fallopian tubes bilaterally.  INDICATIONS:  Ms Dexheimer is a 25 year old, gravida 1, who has been in- house for approximately a week on inpatient bedrest for preeclampsia without severe preeclampsia.  She was induced from long thick and closed, with Cytotec x3 and a Foley bulb and Pitocin over the course of approximately 24 hours.   IUPC and FSE were placed.  The baby had late deceleration, early decelerations, Pitocin was stopped, the patient continued to hyperstim on her own and baby continued to have some recurrent late decelerations and appeared that the baseline was rising.  At the time of C-section was called she was still about 5 cm and baby was still fairly high in the pelvis.  The patient was counseled on primary low-transverse cesarean section for the above reasons.  She was consented and  desired to proceed with surgery.  PROCEDURE IN DETAIL:  Ms Guerreiro was taken to the operating room. Epidural anesthesia was then opted for.  She was placed in the dorsal supine position and prepped and draped in a normal sterile fashion. Time-out was performed.  The time-out was performed prior to draping the patient.  Ancef 2 g IV was administered.  SCDs were on and operating prior to the procedure starting.  The patient was then draped and adequate anesthesia confirmed.  Abdomen was marked for Pfannenstiel skin incision.  Incision was then made with scalpel, carried down to the underlying layer of the fascia with the Bovie.  The patient did have some large subcutaneous vessels that required cautery with the Bovie.  Same on the fascia as well.  Once we were down to the fascia, it was incised with the Bovie and extended laterally with the curved Mayo scissors.  The fascia was then dissected off the fascia sharply with Mayo scissors.  The peritoneum was then stretched.  The peritoneum was identified and it was high clamped x2 and entered after the Metzenbaum scissors were seen through the peritoneum.  Peritoneum was stretched and we  did have to extend the skin incision and the peritoneum.  It was a little tight so it was extended.  Alexis retractor was placed after intra-abdomen was palpated.  Again the lower uterine segment showed the fibroid probably 2 inches below that but eventually after the uterus had started to contract down the fibroid with near the hysterotomy incision.  The transverse incision was then made on the lower uterine segment. Clear fluid was noted.  The incision was extended with the bandage scissors.  Head was delivered to the incision and delivered atraumatically.  Nuchal cord was manually reduced.  Mouth was suctioned. Shoulders delivered easily.  Delayed cord clamping was done once the baby was out.  While we were awaiting the edges of the uterus,  the hysterotomy incision were clamped with ring forceps to minimize bleeding.  Cord was then clamped x2 and cut and handed off to the awaiting staff.  Cord blood obtained.  Placenta was then removed.  She was a little boggy initially but was fundal massage and pressure on the Pitocin and she began to firm up.  The incision was then closed with 0 Vicryl in a continuous running fashion.  Again the fibroid was really at the top portion of the incision and so I gently went around or grabbed the myometrium around that area but I did have to go through the serosa. I was then able to do a second layer for imbrication and again I was on the serosa over the fibroid but it closed nicely.  There was some bleeding on the lower uterine segment, quite briskly and that was closed with a figure-of-eight.  The lower uterine segment was observed and no hematomas or bleeding was noted.  Copious irrigation was performed of all of the gutters.  Interceed was then applied to the lower uterine segment.  Malleable was placed in the abdomen.  The peritoneum was then closed with 2-0 Vicryl in a running fashion.  Copious irrigation was performed again and layer by layer.  The fascia was then reapproximated with 0 Vicryl in a continuous running fashion.  Subcu was closed with 2-0 plain gut and the skin was reapproximated with 4-0 Monocryl in a continuous subcuticular fashion.  All instrument, sponge and needle counts were correct x2.  The patient tolerated the procedure well without complication.     Jola Schmidt, MD   ______________________________ Jola Schmidt, MD    EBV/MEDQ  D:  08/23/2015  T:  08/23/2015  Job:  [id number]

## 2015-08-24 NOTE — Lactation Note (Signed)
This note was copied from a baby's chart. Lactation Consultation Note  Patient Name: Miranda Adams [id number] Date: 08/24/2015 Reason for consult: Difficult latch;Infant < 6lbs Follow up visit made.  Mom states baby showing more interest in feeding but not sustaining latch.  Mom has been pumping every 3 hours and obtaining about 10 mls.  Baby is getting syringe fed expressed milk. Baby is awake and cueing.  Assisted mom with postioning baby in football hold.  Baby opened wide and latched easily but slips off.  20 mm nipple shield used and baby was able to sustain latch and nursed actively.  Instructed mom to continue to post pump about 4 times per day.  Encouraged to call with concerns/assist prn.  Maternal Data    Feeding Feeding Type: Breast Fed  LATCH Score/Interventions Latch: Grasps breast easily, tongue down, lips flanged, rhythmical sucking. (WITH 20 MM NIPPLE SHIELD) Intervention(s): Skin to skin;Teach feeding cues;Waking techniques Intervention(s): Breast compression;Breast massage;Assist with latch;Adjust position  Audible Swallowing: A few with stimulation Intervention(s): Hand expression;Skin to skin Intervention(s): Alternate breast massage  Type of Nipple: Everted at rest and after stimulation  Comfort (Breast/Nipple): Soft / non-tender     Hold (Positioning): Assistance needed to correctly position infant at breast and maintain latch. Intervention(s): Breastfeeding basics reviewed;Support Pillows;Position options;Skin to skin  LATCH Score: 8  Lactation Tools Discussed/Used Tools: Nipple Shields Nipple shield size: 20;24;Other (comment) (20 MM RIGHT 24 MM LEFT)   Consult Status Consult Status: Follow-up Date: 08/25/15 Follow-up type: In-patient    Ave Filter 08/24/2015, 11:27 AM

## 2015-08-24 NOTE — Progress Notes (Addendum)
Miranda Adams [id number]  Subjective: Postpartum Day 1: Primary LTC/S due to [facility] Patient has not ambulated yet--SCDs on intermittently, patient recently removed.  No syncope with movement or sitting up. Feeding:  Breast Contraceptive plan:  Undecided  No BM since 8/5.  Objective: Temp:  [97.9 F (36.6 C)-99.2 F (37.3 C)] 98.1 F (36.7 C) (08/09 0000) Pulse Rate:  [82-121] 86 (08/09 0500) Resp:  [16-19] 16 (08/09 0500) BP: (109-162)/(56-103) 112/74 (08/09 0500) SpO2:  [86 %-99 %] 95 % (08/08 1535)   Vitals:   08/24/15 0201 08/24/15 0300 08/24/15 0400 08/24/15 0500  BP: 116/82 126/85 123/77 112/74  Pulse: 85 86 93 86  Resp: 16 16 16 16   Temp:      TempSrc:      SpO2:      Weight:      Height:        CBC Latest Ref Rng & Units 08/23/2015 08/23/2015 08/22/2015  WBC 4.0 - 10.5 K/uL 18.9(H) 16.0(H) 10.6(H)  Hemoglobin 12.0 - 15.0 g/dL 9.1(L) 9.1(L) 10.5(L)  Hematocrit 36.0 - 46.0 % 26.3(L) 26.8(L) 30.3(L)  Platelets 150 - 400 K/uL 212 192 231   Magnesium infusion begun at 0655 yesterday.  Physical Exam:  General: alert Lochia: appropriate Uterine Fundus: firm Abdomen:  + bowel sounds Incision: Pressure dressing CDI DVT Evaluation: No evidence of DVT seen on physical exam. Negative Homan's sign. Foley draining clear urine.   Assessment/Plan: Status post cesarean delivery, day 1--NRFHR Pre-eclampsia without severe features Acute blood loss anemia--stable hemodynamically Stable Continue current care. D/c magnesium this am s/p 24 hour infusion. [place] labs this am. Close observation of BP--will determine if need to restart Procardia. Monitor patient on current unit x 4 hours s/p magnesium discontinuance.  If stable, may transfer to [facility]. Fe daily. RN to ambulate patient to BR--if tolerates, remove foley. Senokot daily prn, ambulation, Colace, other bowel regimens.   Donnel Saxon MSN, CNM 08/24/2015, 5:52 AM

## 2015-08-25 ENCOUNTER — Encounter (HOSPITAL_COMMUNITY): Payer: Self-pay

## 2015-08-25 MED ORDER — NIFEDIPINE ER 30 MG PO TB24
30.0000 mg | ORAL_TABLET | Freq: Every day | ORAL | Status: DC
  Administered 2015-08-25: 30 mg via ORAL
  Filled 2015-08-25 (×3): qty 1

## 2015-08-25 NOTE — Progress Notes (Signed)
CSW met with MOB for a consult for hx of anxiety.  MOB was polite an receptive to meeting with CSW.  MOB report hx of anxiety while in high school and no signs or symptoms since age 25. CSW educated MOB about PPD. CSW informed MOB of supports and interventions to decrease PPD.  CSW also encouraged MOB to seek medical attention if needed for increased signs and symptoms for PPD.   CSW also provided MOB with a flyer for support groups offered by hospital to new mothers.   Full assessment was not completed by CSW due to limited staffing.  Daila Elbert Boyd-Gilyard, MSW, LCSW Clinical Social Work [phone] 

## 2015-08-25 NOTE — Lactation Note (Signed)
This note was copied from a baby's chart. Lactation Consultation Note  Patient Name: Miranda Adams [id number] Date: 08/25/2015 Reason for consult: Follow-up assessment;Infant < 6lbs baby able to latch just beyond nipple, but mom reports he has been pinching her nipple. I showed mom how to latch baby deeper, and how a deep latch looks. [name] was latched, but few suckles. I had mom pump , coconut oil used with 24 flanges. Mom knows to increase to 27 flanges if 24 cause discomfort. Mom is pumping transitional milk, and will bottle feed this to [name], and pump every 3 hours, after breastfeeding.    Maternal Data    Feeding Feeding Type: Breast Milk Nipple Type: Slow - flow Length of feed: 15 min  LATCH Score/Interventions Latch: Too sleepy or reluctant, no latch achieved, no sucking elicited. Intervention(s): Skin to skin;Teach feeding cues;Waking techniques Intervention(s): Breast massage;Assist with latch  Audible Swallowing: None Intervention(s): Hand expression  Type of Nipple: Everted at rest and after stimulation  Comfort (Breast/Nipple): Soft / non-tender  Problem noted: Filling  Hold (Positioning): Assistance needed to correctly position infant at breast and maintain latch.  LATCH Score: 5  Lactation Tools Discussed/Used Tools: Pump Pump Review: Setup, frequency, and cleaning;Milk Storage;Other (comment) (hand expresion reviewed)   Consult Status Consult Status: Follow-up Date: 08/26/15 Follow-up type: In-patient    Tonna Corner 08/25/2015, 4:31 PM

## 2015-08-25 NOTE — Progress Notes (Signed)
Found Pt sleeping with infant son on chest. Settled infant into crib and re-educated pt to put baby back in crib and to not sleep with infant.

## 2015-08-25 NOTE — Progress Notes (Signed)
Checked on baby due to a tag time out, found baby asleep on mom, slide down side ways under the blankets on her stomach breathing rapidly. Mom was difficult to wake, showed her the babies position, removed infant settled him down and placed him in crib. Re-educated pt. again about the dangers of sleeping with infant and hospital policies.

## 2015-08-25 NOTE — Progress Notes (Signed)
Miranda Adams Female, 25 y.o., Feb 02, 1990  Subjective: Postpartum Day 2: Cesarean Delivery Patient reports tolerating PO, + flatus, + BM and no problems voiding.    Objective: Vital signs in last 24 hours: Temp:  [98.2 F (36.8 C)-98.7 F (37.1 C)] 98.5 F (36.9 C) (08/10 1000) Pulse Rate:  [79-107] 84 (08/10 1000) Resp:  [18] 18 (08/10 1000) BP: (135-153)/(77-97) 135/77 (08/10 1000) SpO2:  [98 %-100 %] 98 % (08/10 1000) Weight:  [81.3 kg (179 lb 4 oz)-83.6 kg (184 lb 3.2 oz)] 81.3 kg (179 lb 4 oz) (08/10 0700)  Vitals:   08/25/15 0115 08/25/15 0529 08/25/15 0700 08/25/15 1000  BP: (!) 153/93 (!) 143/92  135/77  Pulse: 91 79  84  Resp: 18 18  18   Temp: 98.7 F (37.1 C) 98.3 F (36.8 C)  98.5 F (36.9 C)  TempSrc: Oral Oral  Oral  SpO2: 100% 100%  98%  Weight:   81.3 kg (179 lb 4 oz)   Height:         Physical Exam:  General: alert, cooperative and no distress Lochia: appropriate Uterine Fundus: firm Incision: healing well, no significant drainage DVT Evaluation: No evidence of DVT seen on physical exam. Calf/Ankle edema is present.   Recent Labs  08/23/15 0817 08/24/15 0554  HGB 9.1* 8.1*  HCT 26.3* 23.3*   . docusate sodium  100 mg Oral BID  . ferrous sulfate  325 mg Oral Q breakfast  .  HYDROmorphone (DILAUDID) injection  1 mg Intravenous Once  . ibuprofen  600 mg Oral Q6H  . measles, mumps and rubella vaccine  0.5 mL Subcutaneous Once  . NIFEdipine  30 mg Oral Daily  . prenatal multivitamin  1 tablet Oral Q1200  . scopolamine  1 patch Transdermal Once  . senna-docusate  2 tablet Oral Q24H  . simethicone  80 mg Oral TID PC  . simethicone  80 mg Oral Q24H  . Tdap  0.5 mL Intramuscular Once    Assessment/Plan: Status post Cesarean section. S/p postpartum magnesium sulfate for preeclampsia. Doing well postoperatively.   Continue current care Procardia XL for elevated Bps  Declines circumcision of neonate For nexplanon for  contraception.  [facility] W[place]URU 08/25/2015, 12:12 PM

## 2015-08-25 NOTE — Lactation Note (Signed)
This note was copied from a baby's chart. Lactation Consultation Note New mom has 37 wk. Baby is sleepy at breast. Attempted to latch, not interested in BF at this time.  Mom was just transferred from Antenatal unit. Encouraged to use DEBP every three hours. Hand expressed 69ml. Gave to baby via bottle and slow flow nipple. Gave Alementum formula as supplement as well.gave mom LPI information sheet, reviewed feeding and supplementing baby. Baby had 7% weight loss at 44 hrs. Wt. 5.3lbs. Baby has been spity at times probably contributes to weight loss. Had 6 voids, 5 stools, and 4 emesis. Baby has gagged several times while [facility] consult.took supplement well. Mom plans to BF as long as she can and pump and bottle when she returns to work. After LC left rm. Mom called out stating baby just threw up all he took from bottle. Mom was holding baby upright, and had burped well between colostrum and formula. Encouraged strict I&O.  Patient Name: Miranda Adams [id number] Date: 08/25/2015 Reason for consult: Follow-up assessment;Infant < 6lbs;Late preterm infant;Infant weight loss   Maternal Data    Feeding Feeding Type: Breast Milk with Formula added Nipple Type: Slow - flow Length of feed: 0 min  LATCH Score/Interventions Latch: Too sleepy or reluctant, no latch achieved, no sucking elicited. Intervention(s): Teach feeding cues;Waking techniques Intervention(s): Breast massage;Breast compression;Assist with latch  Audible Swallowing: None Intervention(s): Hand expression;Alternate breast massage  Type of Nipple: Everted at rest and after stimulation  Comfort (Breast/Nipple): Soft / non-tender     Hold (Positioning): Full assist, staff holds infant at breast Intervention(s): Breastfeeding basics reviewed;Support Pillows;Position options;Skin to skin  LATCH Score: 4  Lactation Tools Discussed/Used Tools: Pump Breast pump type: Double-Electric Breast Pump   Consult Status Consult  Status: Follow-up Date: 08/25/15 Follow-up type: In-patient    Daizha Anand, [place]ta [place] 08/25/2015, 4:03 AM

## 2015-08-26 MED ORDER — NIFEDIPINE ER OSMOTIC RELEASE 30 MG PO TB24: 30.0000 mg | ORAL_TABLET | Freq: Every day | ORAL | 1 refills | Status: DC

## 2015-08-26 MED ORDER — HYDROMORPHONE HCL 2 MG PO TABS: 2.0000 mg | ORAL_TABLET | ORAL | 0 refills | Status: DC | PRN

## 2015-08-26 MED ORDER — IBUPROFEN 800 MG PO TABS: 800.0000 mg | ORAL_TABLET | Freq: Three times a day (TID) | ORAL | 1 refills | Status: DC | PRN

## 2015-08-26 NOTE — Lactation Note (Signed)
This note was copied from a baby's chart. Lactation Consultation Note  Patient Name: Miranda Adams [id number] Date: 08/26/2015   Met with Mom on day of discharge.  Baby 78 hrs old, weighing 5 lbs 3.1 oz (8% weight loss).  Output good.  Mom had baby sleeping on her chest.  She had just pumped using the Symphony pump, and obtained 45 ml.  Mom knows to awaken baby for feedings at 3 hrs, feeding him sooner if he acts hungry sooner.  Mom to breast feed baby, and follow with supplement of 30 ml EBM+/formula by slow flow bottle.  Encouraged lots of skin to skin.  Has a Medela DEBP at home (from her insurance), and will continue pumping >8 times in 24 hrs.  Engorgment prevention and treatment discussed.  Offered to make an OP Lactation appointment, but Mom will call in 2-3 weeks as she is staying in [place] for a while.  Suggested she call us with any questions.  Reminded Mom of OP lactation services available.    [name] 08/26/2015, 12:19 PM

## 2015-08-26 NOTE — Discharge Instructions (Signed)
Postpartum Depression and Baby Blues °The postpartum period begins right after the birth of a baby. During this time, there is often a great amount of joy and excitement. It is also a time of many changes in the life of the parents. Regardless of how many times a mother gives birth, each child brings new challenges and dynamics to the family. It is not unusual to have feelings of excitement along with confusing shifts in moods, emotions, and thoughts. All mothers are at risk of developing postpartum depression or the "baby blues." These mood changes can occur right after giving birth, or they may occur many months after giving birth. The baby blues or postpartum depression can be mild or severe. Additionally, postpartum depression can go away rather quickly, or it can be a long-term condition.  °CAUSES °Raised hormone levels and the rapid drop in those levels are thought to be a main cause of postpartum depression and the baby blues. A number of hormones change during and after pregnancy. Estrogen and progesterone usually decrease right after the delivery of your baby. The levels of thyroid hormone and various cortisol steroids also rapidly drop. Other factors that play a role in these mood changes include major life events and genetics.  °RISK FACTORS °If you have any of the following risks for the baby blues or postpartum depression, know what symptoms to watch out for during the postpartum period. Risk factors that may increase the likelihood of getting the baby blues or postpartum depression include: °· Having a personal or family history of depression.   °· Having depression while being pregnant.   °· Having premenstrual mood issues or mood issues related to oral contraceptives. °· Having a lot of life stress.   °· Having marital conflict.   °· Lacking a social support network.   °· Having a baby with special needs.   °· Having health problems, such as diabetes.   °SIGNS AND SYMPTOMS °Symptoms of baby blues  include: °· Brief changes in mood, such as going from extreme happiness to sadness. °· Decreased concentration.   °· Difficulty sleeping.   °· Crying spells, tearfulness.   °· Irritability.   °· Anxiety.   °Symptoms of postpartum depression typically begin within the first month after giving birth. These symptoms include: °· Difficulty sleeping or excessive sleepiness.   °· Marked weight loss.   °· Agitation.   °· Feelings of worthlessness.   °· Lack of interest in activity or food.   °Postpartum psychosis is a very serious condition and can be dangerous. Fortunately, it is rare. Displaying any of the following symptoms is cause for immediate medical attention. Symptoms of postpartum psychosis include:  °· Hallucinations and delusions.   °· Bizarre or disorganized behavior.   °· Confusion or disorientation.   °DIAGNOSIS  °A diagnosis is made by an evaluation of your symptoms. There are no medical or lab tests that lead to a diagnosis, but there are various questionnaires that a health care provider may use to identify those with the baby blues, postpartum depression, or psychosis. Often, a screening tool called the Edinburgh Postnatal Depression Scale is used to diagnose depression in the postpartum period.  °TREATMENT °The baby blues usually goes away on its own in 1-2 weeks. Social support is often all that is needed. You will be encouraged to get adequate sleep and rest. Occasionally, you may be given medicines to help you sleep.  °Postpartum depression requires treatment because it can last several months or longer if it is not treated. Treatment may include individual or group therapy, medicine, or both to address any social, physiological, and psychological   factors that may play a role in the depression. Regular exercise, a healthy diet, rest, and social support may also be strongly recommended.  Postpartum psychosis is more serious and needs treatment right away. Hospitalization is often needed. HOME CARE  INSTRUCTIONS  Get as much rest as you can. Nap when the baby sleeps.   Exercise regularly. Some women find yoga and walking to be beneficial.   Eat a balanced and nourishing diet.   Do little things that you enjoy. Have a cup of tea, take a bubble bath, read your favorite magazine, or listen to your favorite music.  Avoid alcohol.   Ask for help with household chores, cooking, grocery shopping, or running errands as needed. Do not try to do everything.   Talk to people close to you about how you are feeling. Get support from your partner, family members, friends, or other new moms.  Try to stay positive in how you think. Think about the things you are grateful for.   Do not spend a lot of time alone.   Only take over-the-counter or prescription medicine as directed by your health care provider.  Keep all your postpartum appointments.   Let your health care provider know if you have any concerns.  SEEK MEDICAL CARE IF: You are having a reaction to or problems with your medicine. SEEK IMMEDIATE MEDICAL CARE IF:  You have suicidal feelings.   You think you may harm the baby or someone else. MAKE SURE YOU:  Understand these instructions.  Will watch your condition.  Will get help right away if you are not doing well or get worse.   This information is not intended to replace advice given to you by your health care provider. Make sure you discuss any questions you have with your health care provider.   Document Released: 10/06/2003 Document Revised: 01/06/2013 Document Reviewed: 10/13/2012 Elsevier Interactive Patient Education 2016 [employer] Cesarean Delivery, Care After Refer to this sheet in the next few weeks. These instructions provide you with information on caring for yourself after your procedure. Your health care provider may also give you specific instructions. Your treatment has been planned according to current medical practices, but problems  sometimes occur. Call your health care provider if you have any problems or questions after you go home. HOME CARE INSTRUCTIONS  Only take over-the-counter or prescription medications as directed by your health care provider.  Do not drink alcohol, especially if you are breastfeeding or taking medication to relieve pain.  Do not chew or smoke tobacco.  Continue to use good perineal care. Good perineal care includes:  Wiping your perineum from front to back.  Keeping your perineum clean.  Check your surgical cut (incision) daily for increased redness, drainage, swelling, or separation of skin.  Clean your incision gently with soap and water every day, and then pat it dry. If your health care provider says it is okay, leave the incision uncovered. Use a bandage (dressing) if the incision is draining fluid or appears irritated. If the adhesive strips across the incision do not fall off within 7 days, carefully peel them off.  Hug a pillow when coughing or sneezing until your incision is healed. This helps to relieve pain.  Do not use tampons or douche until your health care provider says it is okay.  Shower, wash your hair, and take tub baths as directed by your health care provider.  Wear a well-fitting bra that provides breast support.  Limit wearing support panties or  control-top hose.  Drink enough fluids to keep your urine clear or pale yellow.  Eat high-fiber foods such as whole grain cereals and breads, brown rice, beans, and fresh fruits and vegetables every day. These foods may help prevent or relieve constipation.  Resume activities such as climbing stairs, driving, lifting, exercising, or traveling as directed by your health care provider.  Talk to your health care provider about resuming sexual activities. This is dependent upon your risk of infection, your rate of healing, and your comfort and desire to resume sexual activity.  Try to have someone help you with your  household activities and your newborn for at least a few days after you leave the hospital.  Rest as much as possible. Try to rest or take a nap when your newborn is sleeping.  Increase your activities gradually.  Keep all of your scheduled postpartum appointments. It is very important to keep your scheduled follow-up appointments. At these appointments, your health care provider will be checking to make sure that you are healing physically and emotionally. SEEK MEDICAL CARE IF:   You are passing large clots from your vagina. Save any clots to show your health care provider.  You have a foul smelling discharge from your vagina.  You have trouble urinating.  You are urinating frequently.  You have pain when you urinate.  You have a change in your bowel movements.  You have increasing redness, pain, or swelling near your incision.  You have pus draining from your incision.  Your incision is separating.  You have painful, hard, or reddened breasts.  You have a severe headache.  You have blurred vision or see spots.  You feel sad or depressed.  You have thoughts of hurting yourself or your newborn.  You have questions about your care, the care of your newborn, or medications.  You are dizzy or light-headed.  You have a rash.  You have pain, redness, or swelling at the site of the removed intravenous access (IV) tube.  You have nausea or vomiting.  You stopped breastfeeding and have not had a menstrual period within 12 weeks of stopping.  You are not breastfeeding and have not had a menstrual period within 12 weeks of delivery.  You have a fever. SEEK IMMEDIATE MEDICAL CARE IF:  You have persistent pain.  You have chest pain.  You have shortness of breath.  You faint.  You have leg pain.  You have stomach pain.  Your vaginal bleeding saturates 2 or more sanitary pads in 1 hour. MAKE SURE YOU:   Understand these instructions.  Will watch your  condition.  Will get help right away if you are not doing well or get worse.   This information is not intended to replace advice given to you by your health care provider. Make sure you discuss any questions you have with your health care provider.   Document Released: 09/23/2001 Document Revised: 01/22/2014 Document Reviewed: 08/29/2011 Elsevier Interactive Patient Education [employer].

## 2015-08-26 NOTE — Discharge Summary (Signed)
[facility] Discharge Summary   Patient Name:   Miranda Adams DOB:     [birth year] MRN:     [id number]  Date of Admission:   08/13/2015 Date of Discharge:  08/26/2015  Admitting diagnosis:    36 WKS, PRECLAMPSIA Principal Problem:   Status post primary low transverse cesarean section--NRFHR Active Problems:   Pre-eclampsia in third trimester PIH Anemia    Discharge diagnosis:    Same                                         Post partum procedures: None  Type of Delivery:  Primary low transverse cesarean section on 08/23/2015  Delivering Provider: Thurnell Lose   Date of Delivery:  08/23/2015  Newborn Data:    Live born female  Birth Weight: 5 lb 10.1 oz (2555 g) APGAR: 8, 9  Baby's Name:  Alpha Gula Feeding:   Bottle and Breast Disposition:   home with mother  Complications:   Preeclampsia  Hospital course:      The patient was admitted to the hospital for observation of preeclampsia without severe features on 08/13/2015. The patient was given betamethasone in preparation of an early delivery. Induction was recommended at [redacted] weeks gestation. The patient was started on Cytotec and Foley balloon. She experienced a category 2 and category 3 tracing that did not resolve with resuscitative measures. On 08/23/2015 the patient was taken for cesarean section where she delivered a healthy female infant. The patient was given magnesium sulfate for 24 hours after delivery. Her postpartum and postop course were uneventful. She quickly tolerated a regular diet. On postoperative day 3 she was felt to be ready for discharge. She was treated with Procardia XL 30 during her postpartum stay.  Physical Exam:   Vitals:   08/25/15 1747 08/25/15 2135 08/26/15 0156 08/26/15 0534  BP: 137/90 (!) 137/96 (!) 145/91 132/74  Pulse: 87 87 90 85  Resp: 18 18 16 14   Temp: 98.1 F (36.7 C) 98.6 F (37 C) 98.3 F (36.8 C) 98.7 F (37.1 C)  TempSrc: Oral Oral Oral Oral   SpO2: 99% 97% 99% 99%  Weight:    172 lb 12 oz (78.4 kg)  Height:       General: alert and no distress Lochia: appropriate Uterine Fundus: firm Incision: Dressing is clean, dry, and intact DVT Evaluation: No evidence of DVT seen on physical exam.  Labs: Lab Results  Component Value Date   WBC 15.3 (H) 08/24/2015   HGB 8.1 (L) 08/24/2015   HCT 23.3 (L) 08/24/2015   MCV 90.0 08/24/2015   PLT 208 08/24/2015   CMP Latest Ref Rng & Units 08/24/2015  Glucose 65 - 99 mg/dL 83  BUN 6 - 20 mg/dL 6  Creatinine 0.44 - 1.00 mg/dL 0.57  Sodium 135 - 145 mmol/L 134(L)  Potassium 3.5 - 5.1 mmol/L 4.2  Chloride 101 - 111 mmol/L 103  CO2 22 - 32 mmol/L 25  Calcium 8.9 - 10.3 mg/dL 7.1(L)  Total Protein 6.5 - 8.1 g/dL 5.6(L)  Total Bilirubin 0.3 - 1.2 mg/dL 0.4  Alkaline Phos 38 - 126 U/L 140(H)  AST 15 - 41 U/L 19  ALT 14 - 54 U/L 17    Discharge instruction: per After Visit Summary and "Baby and Me Booklet".  After Visit Meds:    Medication List    TAKE these  medications   calcium carbonate 500 MG chewable tablet Commonly known as:  TUMS - dosed in mg elemental calcium Chew 1 tablet by mouth as needed for indigestion or heartburn.   ferrous sulfate 325 (65 FE) MG tablet Take 325 mg by mouth daily with breakfast.   HYDROmorphone 2 MG tablet Commonly known as:  DILAUDID Take 1 tablet (2 mg total) by mouth every 4 (four) hours as needed for severe pain.   ibuprofen 800 MG tablet Commonly known as:  ADVIL,MOTRIN Take 1 tablet (800 mg total) by mouth every 8 (eight) hours as needed.   NIFEdipine 30 MG 24 hr tablet Commonly known as:  PROCARDIA-XL/ADALAT-CC/NIFEDICAL-XL Take 1 tablet (30 mg total) by mouth daily.   prenatal multivitamin Tabs tablet Take 1 tablet by mouth daily.       Diet: routine diet  Activity: Advance as tolerated. Pelvic rest for 6 weeks.   Outpatient follow up:2 weeks Follow up Appt:No future appointments. Follow up visit: No Follow-up on  file.  Postpartum contraception: Nexplanon  08/26/2015 Eli Hose, MD

## 2015-12-29 ENCOUNTER — Encounter (HOSPITAL_COMMUNITY): Payer: Self-pay

## 2015-12-29 ENCOUNTER — Emergency Department (HOSPITAL_COMMUNITY)
Admission: EM | Admit: 2015-12-29 | Discharge: 2015-12-29 | Disposition: A | Discharge status: Home / Self Care | Payer: Managed Care, Other (non HMO) | Attending: Physician Assistant | Admitting: Physician Assistant

## 2015-12-29 DIAGNOSIS — Z87891 Personal history of nicotine dependence: Secondary | ICD-10-CM | POA: Insufficient documentation

## 2015-12-29 DIAGNOSIS — R1110 Vomiting, unspecified: Secondary | ICD-10-CM | POA: Diagnosis present

## 2015-12-29 DIAGNOSIS — R197 Diarrhea, unspecified: Secondary | ICD-10-CM | POA: Diagnosis not present

## 2015-12-29 DIAGNOSIS — Z79899 Other long term (current) drug therapy: Secondary | ICD-10-CM | POA: Insufficient documentation

## 2015-12-29 DIAGNOSIS — R112 Nausea with vomiting, unspecified: Secondary | ICD-10-CM | POA: Diagnosis not present

## 2015-12-29 LAB — COMPREHENSIVE METABOLIC PANEL
ALT: 14 U/L (ref 14–54)
ANION GAP: 10 (ref 5–15)
AST: 21 U/L (ref 15–41)
Albumin: 4.3 g/dL (ref 3.5–5.0)
Alkaline Phosphatase: 90 U/L (ref 38–126)
BILIRUBIN TOTAL: 1.1 mg/dL (ref 0.3–1.2)
BUN: 11 mg/dL (ref 6–20)
CHLORIDE: 105 mmol/L (ref 101–111)
CO2: 23 mmol/L (ref 22–32)
Calcium: 9 mg/dL (ref 8.9–10.3)
Creatinine, Ser: 0.88 mg/dL (ref 0.44–1.00)
GFR calc Af Amer: >60 mL/min (ref >60)
Glucose, Bld: 103 mg/dL — ABNORMAL HIGH (ref 65–99)
POTASSIUM: 3.1 mmol/L — AB (ref 3.5–5.1)
Sodium: 138 mmol/L (ref 135–145)
TOTAL PROTEIN: 8.2 g/dL — AB (ref 6.5–8.1)

## 2015-12-29 LAB — URINALYSIS, ROUTINE W REFLEX MICROSCOPIC
BACTERIA UA: NONE SEEN
BILIRUBIN URINE: NEGATIVE
Glucose, UA: NEGATIVE mg/dL
HGB URINE DIPSTICK: NEGATIVE
KETONES UR: NEGATIVE mg/dL
LEUKOCYTES UA: NEGATIVE
NITRITE: NEGATIVE
Protein, ur: 100 mg/dL — AB
Specific Gravity, Urine: 1.036 — ABNORMAL HIGH (ref 1.005–1.030)
pH: 5 (ref 5.0–8.0)

## 2015-12-29 LAB — CBC
HEMATOCRIT: 37.1 % (ref 36.0–46.0)
HEMOGLOBIN: 13 g/dL (ref 12.0–15.0)
MCH: 30.4 pg (ref 26.0–34.0)
MCHC: 35 g/dL (ref 30.0–36.0)
MCV: 86.7 fL (ref 78.0–100.0)
Platelets: 298 10*3/uL (ref 150–400)
RBC: 4.28 MIL/uL (ref 3.87–5.11)
RDW: 12.2 % (ref 11.5–15.5)
WBC: 5.6 10*3/uL (ref 4.0–10.5)

## 2015-12-29 LAB — LIPASE, BLOOD: LIPASE: 26 U/L (ref 11–51)

## 2015-12-29 LAB — PREGNANCY, URINE: PREG TEST UR: NEGATIVE

## 2015-12-29 MED ORDER — ONDANSETRON HCL 4 MG PO TABS: 4.0000 mg | ORAL_TABLET | Freq: Three times a day (TID) | ORAL | 0 refills | Status: DC | PRN

## 2015-12-29 MED ORDER — SODIUM CHLORIDE 0.9 % IV BOLUS (SEPSIS)
1000.0000 mL | Freq: Once | INTRAVENOUS | Status: AC
  Administered 2015-12-29: 1000 mL via INTRAVENOUS

## 2015-12-29 NOTE — ED Provider Notes (Signed)
[facility] DEPT Provider Note   CSN: [id number] Arrival date & time: 12/29/15  1236     History   Chief Complaint Chief Complaint  Patient presents with  . Emesis  . Abdominal Pain  . Diarrhea    HPI Marche Granier is a 25 y.o. female.  HPI   Pt well appearing 25 yo female who ate [employer] in [place] last night and had vomiting and diarrhea last night, a alittle this morning. No nauseated now. Here with her 68 month old.  No blood in vomit or diarrhea. Occasional crampy pain. Comes and goes.  Past Medical History:  Diagnosis Date  . Anxiety     Patient Active Problem List   Diagnosis Date Noted  . Status post primary low transverse cesarean section--NRFHR 08/23/2015  . Pre-eclampsia in third trimester 08/13/2015  . Hypertension in pregnancy, preeclampsia 08/12/2015    Past Surgical History:  Procedure Laterality Date  . CESAREAN SECTION N/A 08/23/2015   Procedure: CESAREAN SECTION;  Surgeon: Thurnell Lose, MD;  Location: [facility];  Service: Obstetrics;  Laterality: N/A;  . NO PAST SURGERIES    . none      OB History    Gravida Para Term Preterm AB Living   1 1 1     1    SAB TAB Ectopic Multiple Live Births         0 1       Home Medications    Prior to Admission medications   Medication Sig Start Date End Date Taking? Authorizing Provider  ibuprofen (ADVIL,MOTRIN) 800 MG tablet Take 1 tablet (800 mg total) by mouth every 8 (eight) hours as needed. 08/26/15  Yes Ena Dawley, MD  prenatal vitamin w/FE, FA (PRENATAL 1 + 1) 27-1 MG TABS tablet Take 1 tablet by mouth daily at 12 noon.   Yes Historical Provider, MD  HYDROmorphone (DILAUDID) 2 MG tablet Take 1 tablet (2 mg total) by mouth every 4 (four) hours as needed for severe pain. Patient not taking: Reported on 12/29/2015 08/26/15   Ena Dawley, MD  NIFEdipine (PROCARDIA-XL/ADALAT-CC/NIFEDICAL-XL) 30 MG 24 hr tablet Take 1 tablet (30 mg total) by mouth daily. Patient not  taking: Reported on 12/29/2015 08/26/15   Ena Dawley, MD    Family History Family History  Problem Relation Age of Onset  . Hypertension Mother   . Psychiatric Illness Mother   . Thyroid disease Mother   . Breast cancer Maternal Grandmother   . Cancer Paternal Grandmother     unknown type  . Colon cancer Neg Hx   . Esophageal cancer Neg Hx     Social History Social History  Substance Use Topics  . Smoking status: Former Research scientist (life sciences)  . Smokeless tobacco: Never Used     Comment: form given 03-10-14  . Alcohol use No     Comment: Rarely     Allergies   Iodine and Shellfish allergy   Review of Systems Review of Systems  Constitutional: Positive for appetite change. Negative for activity change, fatigue and fever.  Respiratory: Negative for shortness of breath.   Cardiovascular: Negative for chest pain.  Gastrointestinal: Positive for abdominal pain, diarrhea, nausea and vomiting.  All other systems reviewed and are negative.    Physical Exam Updated Vital Signs BP 132/92 (BP Location: Left Arm)   Pulse 120   Temp 99.5 F (37.5 C) (Oral)   Resp 20   Ht 5\' 1"  (1.549 m)   Wt 167 lb 3.2 oz (75.8 kg)  SpO2 97%   BMI 31.59 kg/m   Physical Exam  Constitutional: She is oriented to person, place, and time. She appears well-developed and well-nourished.  HENT:  Head: Normocephalic and atraumatic.  Eyes: Right eye exhibits no discharge.  Cardiovascular: Normal rate, regular rhythm and normal heart sounds.   No murmur heard. Pulmonary/Chest: Effort normal and breath sounds normal. She has no wheezes. She has no rales.  Abdominal: Soft. She exhibits no distension. There is no tenderness.  No pain  Neurological: She is oriented to person, place, and time.  Skin: Skin is warm and dry. She is not diaphoretic.  Psychiatric: She has a normal mood and affect.  Nursing note and vitals reviewed.    ED Treatments / Results  Labs (all labs ordered are listed, but only  abnormal results are displayed) Labs Reviewed  COMPREHENSIVE METABOLIC PANEL - Abnormal; Notable for the following:       Result Value   Potassium 3.1 (*)    Glucose, Bld 103 (*)    Total Protein 8.2 (*)    All other components within normal limits  URINALYSIS, ROUTINE W REFLEX MICROSCOPIC - Abnormal; Notable for the following:    Color, Urine AMBER (*)    APPearance HAZY (*)    Specific Gravity, Urine 1.036 (*)    Protein, ur 100 (*)    Squamous Epithelial / LPF 6-30 (*)    All other components within normal limits  LIPASE, BLOOD  CBC    EKG  EKG Interpretation None       Radiology No results found.  Procedures Procedures (including critical care time)  Medications Ordered in ED Medications  sodium chloride 0.9 % bolus 1,000 mL (not administered)     Initial Impression / Assessment and Plan / ED Course  I have reviewed the triage vital signs and the nursing notes.  Pertinent labs & imaging results that were available during my care of the patient were reviewed by me and considered in my medical decision making (see chart for details).  Clinical Course     Pt is well appearing 25 yo femlae here with vomiting and diarrhea. SHe thinks it was likely food poisisng vs viral infection (someone at work had this).  Patient well appearing, moist membranes.  Will give fluids.  She does not want anything for nausea right now. No tenderness on exam.  Will dc with work note, zofran for home.   Final Clinical Impressions(s) / ED Diagnoses   Final diagnoses:  None    New Prescriptions New Prescriptions   No medications on file     Gerrianne Aydelott Julio Alm, MD 12/29/15 1408

## 2015-12-29 NOTE — Discharge Instructions (Signed)
You were seen with either viral Gastroenteritis or food poisoning. Please take Zofran to help with your nausea. Please wash her hands especially around your young child. Please follow up with her primary care physician for other concerns.

## 2015-12-29 NOTE — ED Triage Notes (Signed)
Patient c/o N/V/D and generalized abdominal pain since yesterday.

## 2016-12-21 ENCOUNTER — Other Ambulatory Visit: Payer: Self-pay | Admitting: Obstetrics and Gynecology

## 2016-12-21 DIAGNOSIS — E049 Nontoxic goiter, unspecified: Secondary | ICD-10-CM

## 2016-12-27 ENCOUNTER — Other Ambulatory Visit: Payer: Managed Care, Other (non HMO)

## 2017-01-01 ENCOUNTER — Ambulatory Visit
Admission: RE | Admit: 2017-01-01 | Discharge: 2017-01-01 | Disposition: A | Discharge status: Home / Self Care | Payer: Managed Care, Other (non HMO) | Source: Ambulatory Visit | Attending: Obstetrics and Gynecology | Admitting: Obstetrics and Gynecology

## 2017-01-01 DIAGNOSIS — E049 Nontoxic goiter, unspecified: Secondary | ICD-10-CM

## 2017-02-28 ENCOUNTER — Other Ambulatory Visit (HOSPITAL_COMMUNITY): Payer: Self-pay | Admitting: Endocrinology

## 2017-02-28 DIAGNOSIS — E0590 Thyrotoxicosis, unspecified without thyrotoxic crisis or storm: Secondary | ICD-10-CM

## 2017-03-07 ENCOUNTER — Encounter (HOSPITAL_COMMUNITY): Payer: Self-pay | Admitting: Radiology

## 2017-03-07 ENCOUNTER — Encounter (HOSPITAL_COMMUNITY)
Admission: RE | Admit: 2017-03-07 | Discharge: 2017-03-07 | Disposition: A | Discharge status: Home / Self Care | Payer: Managed Care, Other (non HMO) | Source: Ambulatory Visit | Attending: Endocrinology | Admitting: Endocrinology

## 2017-03-07 DIAGNOSIS — E0590 Thyrotoxicosis, unspecified without thyrotoxic crisis or storm: Secondary | ICD-10-CM | POA: Diagnosis present

## 2017-03-07 MED ORDER — SODIUM IODIDE I 131 CAPSULE
8.2000 | Freq: Once | INTRAVENOUS | Status: AC | PRN
  Administered 2017-03-07: 8.2 via ORAL

## 2017-03-08 ENCOUNTER — Encounter (HOSPITAL_COMMUNITY)
Admission: RE | Admit: 2017-03-08 | Discharge: 2017-03-08 | Disposition: A | Discharge status: Home / Self Care | Payer: Managed Care, Other (non HMO) | Source: Ambulatory Visit | Attending: Endocrinology | Admitting: Endocrinology

## 2017-03-08 MED ORDER — SODIUM PERTECHNETATE TC 99M INJECTION
9.5400 | Freq: Once | INTRAVENOUS | Status: AC | PRN
Start: 2017-03-08 — End: 2017-03-08
  Administered 2017-03-08: 9.54 via INTRAVENOUS

## 2017-03-25 ENCOUNTER — Ambulatory Visit: Payer: Managed Care, Other (non HMO) | Admitting: Nurse Practitioner

## 2018-09-25 IMAGING — US US THYROID
1 series · 14 of 25 positions shown · non-contrast
Comparison: None.

CLINICAL DATA: Goiter.  Difficulty swallowing for 1 week.

EXAM:
THYROID ULTRASOUND
TECHNIQUE: Ultrasound examination of the thyroid gland and adjacent soft
tissues was performed.

[Series 1: us thyroid · 0.06mm/px · 14 of 47 slices shown]
[im 1/47]
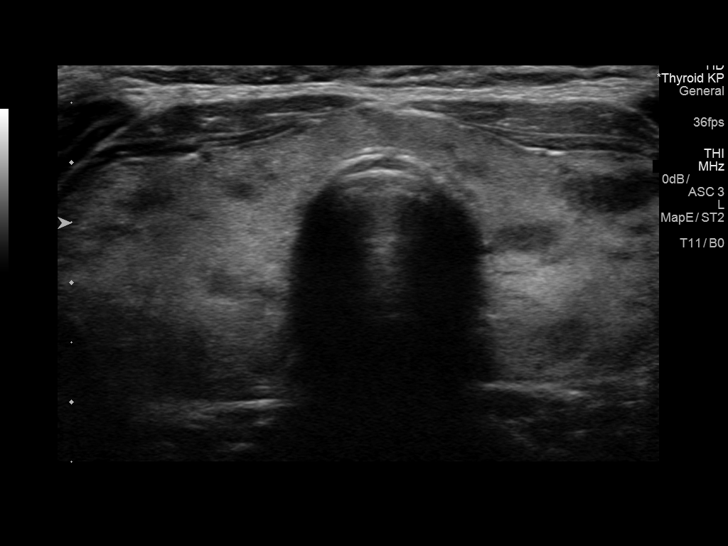
[im 4/47]
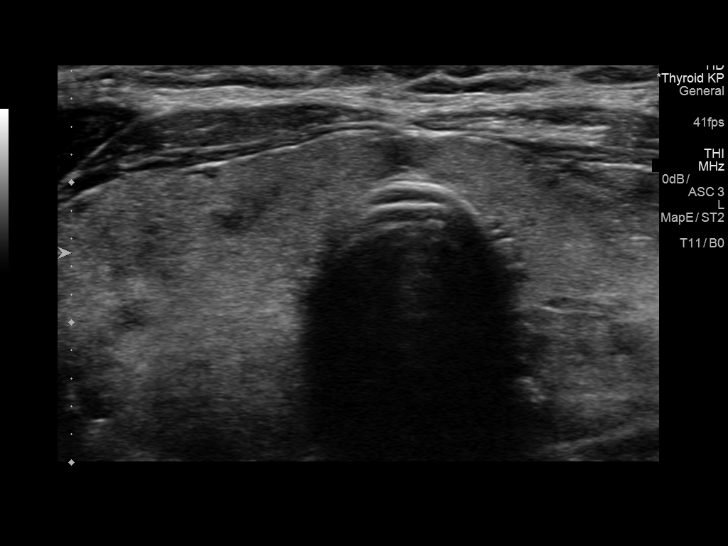
[im 8/47]
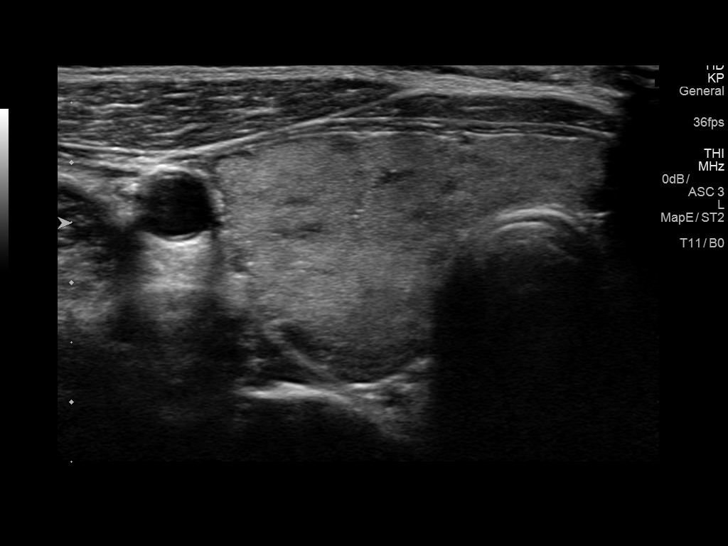
[im 12/47]
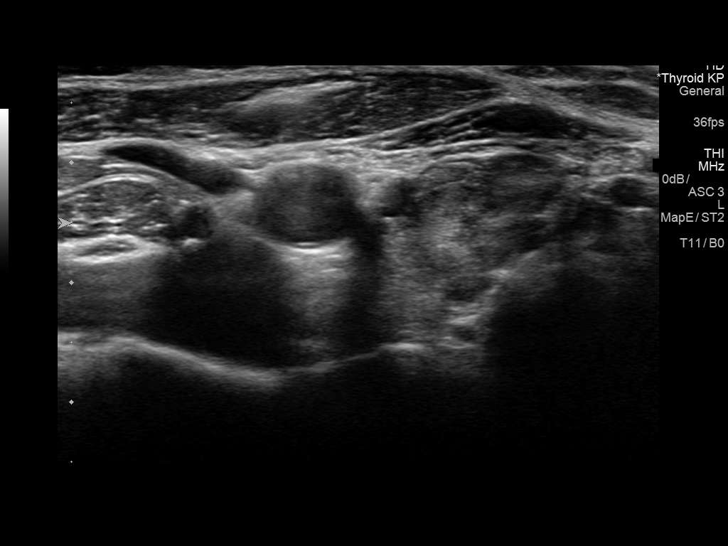
[im 16/47]
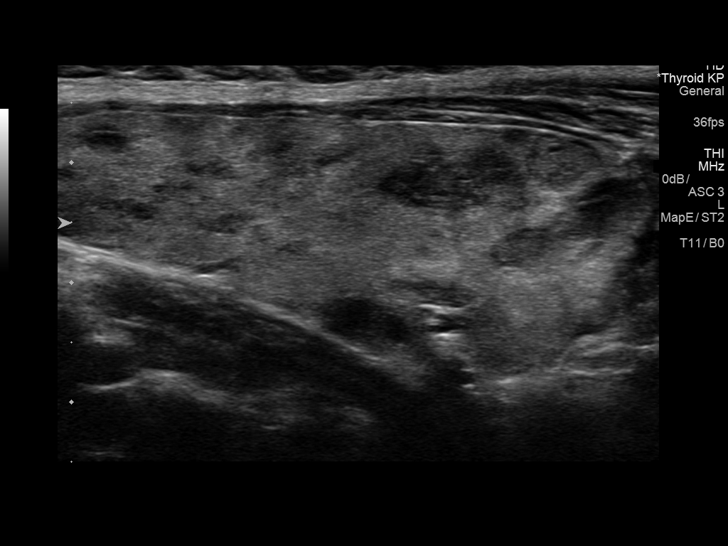
[im 18/47]
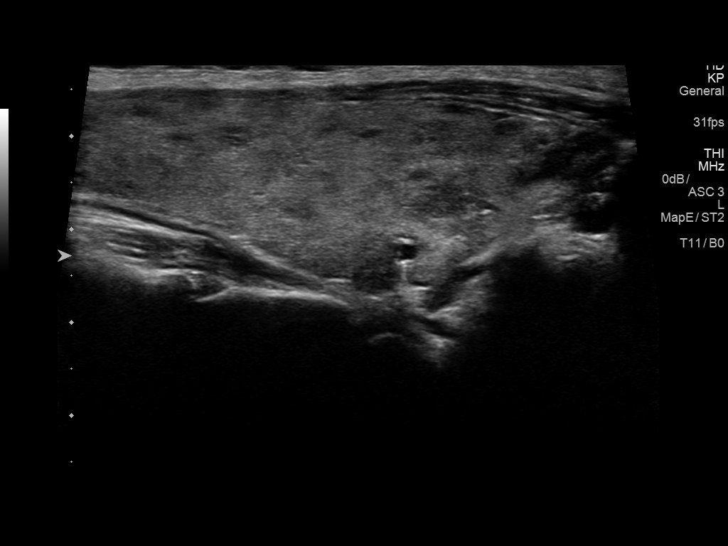
[im 22/47]
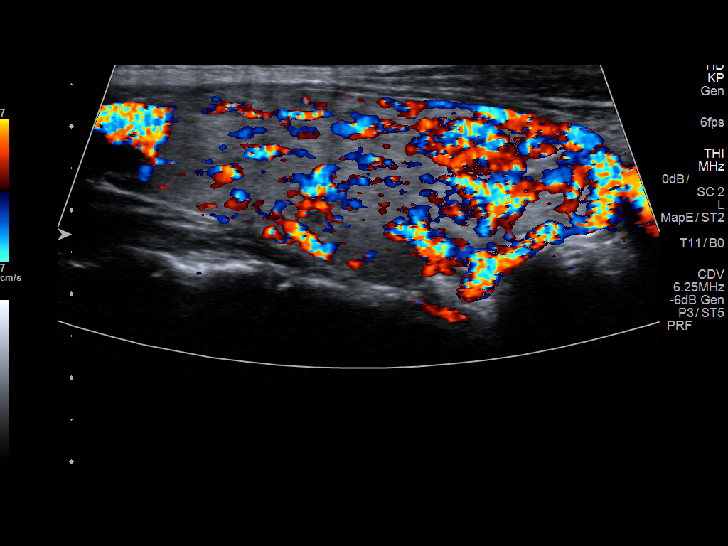
[im 25/47]
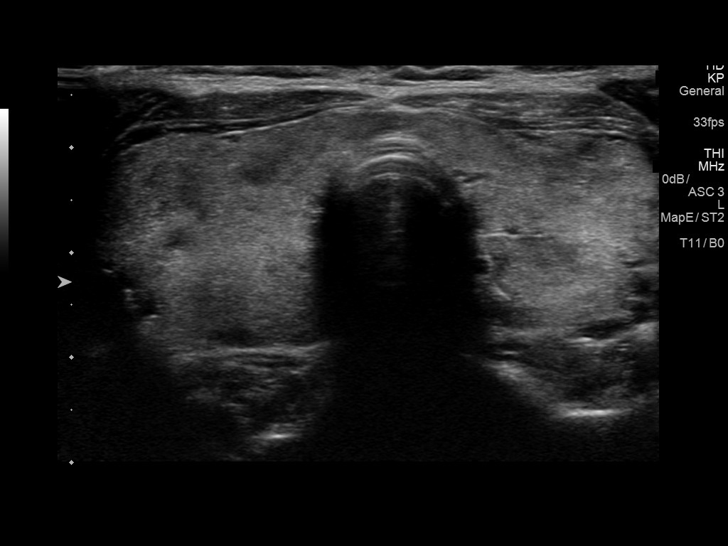
[im 29/47]
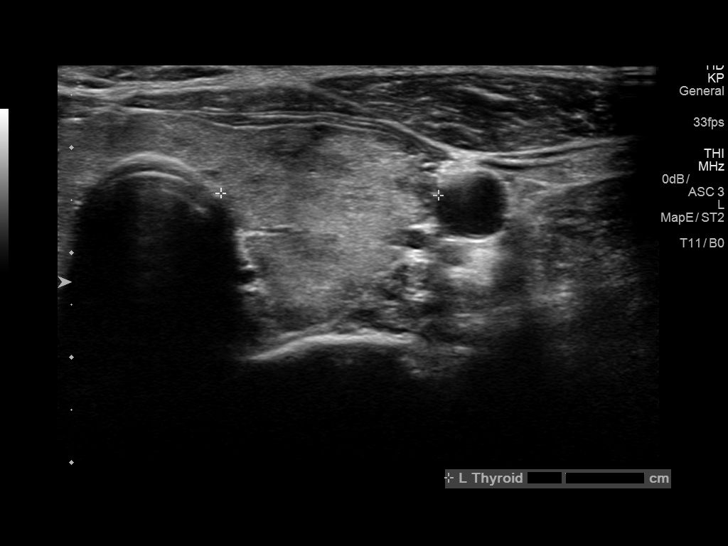
[im 31/47]
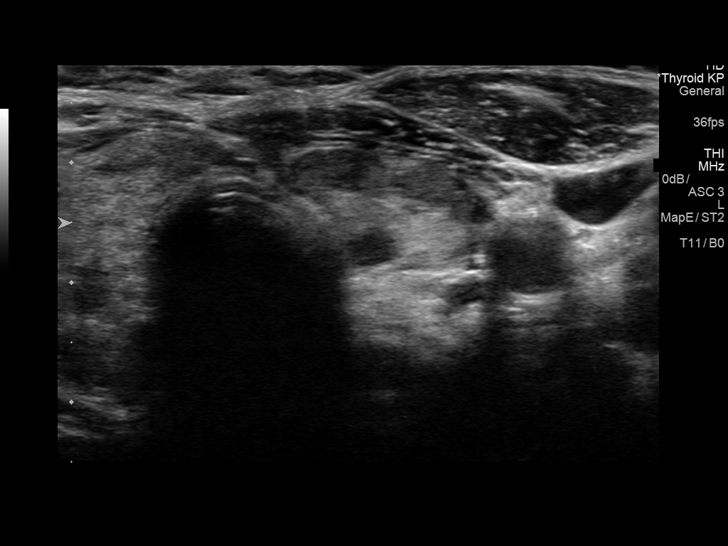
[im 35/47]
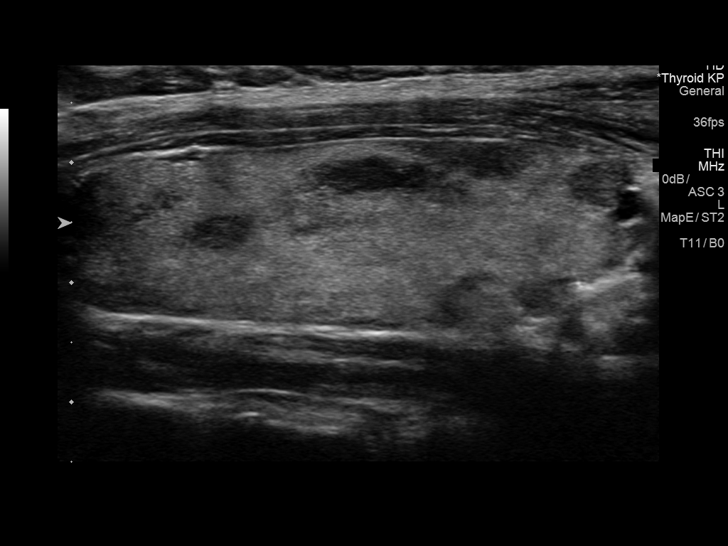
[im 39/47]
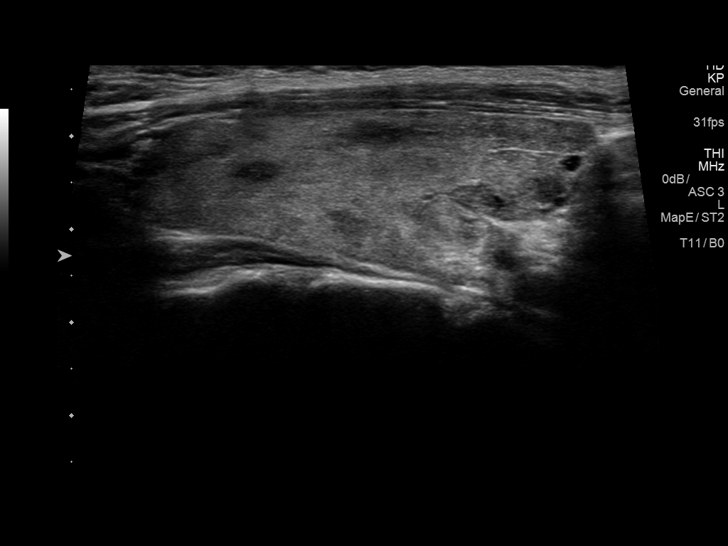
[im 43/47]
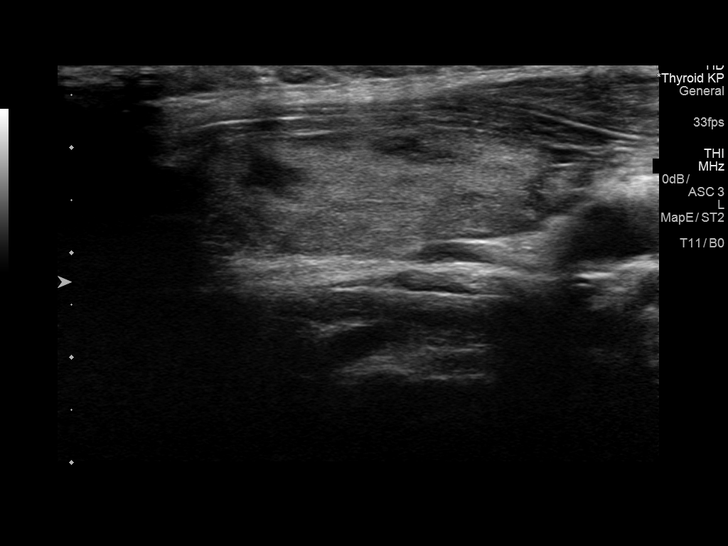
[im 47/47]
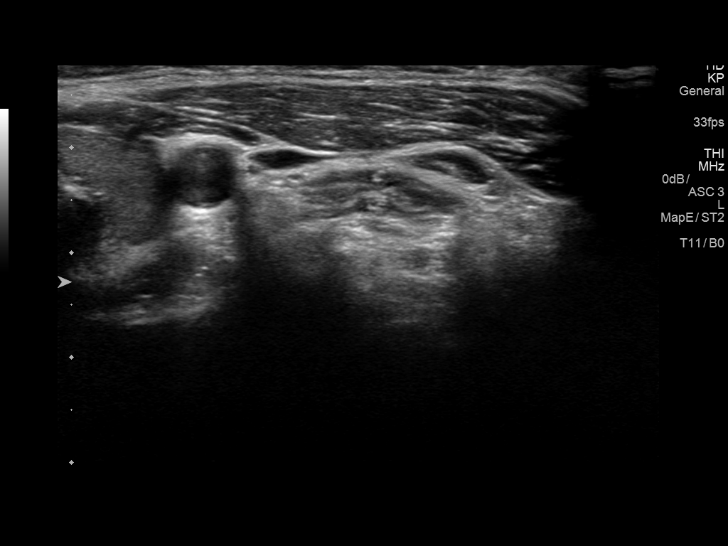

[14 of 25 positions shown; findings below may reference images not displayed]

FINDINGS: Parenchymal Echotexture: Markedly heterogenous

Isthmus: 0.4 cm

Right lobe: 6.1 x 2.0 x 2.1 cm

Left lobe: 5.3 x 1.9 x 2.1 cm

_________________________________________________________

Estimated total number of nodules >/= 1 cm: 0

Number of spongiform nodules >/=  2 cm not described below (TR1): 0

Number of mixed cystic and solid nodules >/= 1.5 cm not described
below (TR2): 0

_________________________________________________________

Thyroid tissue is diffusely heterogeneous with scattered hypoechoic
areas throughout the parenchyma. These hypoechoic areas are poorly
defined and not compatible with discrete nodules. Thyroid tissue is
also hypervascular. No lymph node enlargement.
IMPRESSION: Thyroid tissue is prominent for size, markedly heterogeneous and
hypervascular. Findings are nonspecific but could be associated with
thyroiditis. No discrete thyroid nodules.

## 2020-01-28 ENCOUNTER — Ambulatory Visit: Payer: Self-pay | Admitting: Surgery

## 2020-02-28 ENCOUNTER — Encounter: Payer: Self-pay | Admitting: Surgery

## 2020-02-28 DIAGNOSIS — E0500 Thyrotoxicosis with diffuse goiter without thyrotoxic crisis or storm: Secondary | ICD-10-CM | POA: Diagnosis present

## 2020-02-28 NOTE — H&P (Signed)
General Surgery [facility]  [name] DOB: 23-Sep-1990 Single / Language: Miranda Adams / Race: Black or African American Female   History of Present Illness   The patient is a 30 year old female who presents with Graves disease.  CHIEF COMPLAINT: Graves' disease  Patient is referred by Dr. Jacelyn Pi for surgical evaluation and management of Graves' disease. Patient was diagnosed with hyperthyroidism in 2019. Patient has become progressively symptomatic with tachycardia, fatigue, and blurry vision. She has had some intermittent mild dysphagia. Laboratory studies show a markedly suppressed TSH of 0.005 as of November 30, 2019. Patient has undergone a nuclear medicine radioactive iodine uptake scan as well as an ultrasound examination. Patient is currently taking methimazole and atenolol in anticipation of surgery. Due to the mild symptoms of dysphagia and the ophthalmologic symptoms, the patient has elected surgical treatment rather than radioactive iodine ablation. Patient has had no prior head or neck surgery. She does have a strong family history of thyroid disease in multiple family members who are on medication. There is no history of malignancy. 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   Past Surgical History  No pertinent past surgical history   Diagnostic Studies History  Colonoscopy  never Mammogram  never Pap Smear  1-5 years ago  Allergies  Iodine *ANTISEPTICS & DISINFECTANTS*   Medication History  methIMAzole (10MG  Tablet, Oral) Active. Atenolol (50MG  Tablet, Oral) Active. Nexplanon (68MG  Implant, Subcutaneous) Active. Medications Reconciled  Social History Alcohol use  Heavy alcohol use. Caffeine use  Coffee, Tea. No drug use  Tobacco use  Current some day smoker.  Family History  Depression  Mother. Diabetes Mellitus  Mother. Hypertension  Mother. Thyroid problems  Mother.  Pregnancy / Birth  History  Age at menarche  89 years. Contraceptive History  Contraceptive implant, Oral contraceptives. Gravida  1 Irregular periods  Length (months) of breastfeeding  12-24 Maternal age  68-25 Para  53  Other Problems  Anxiety Disorder  Thyroid Disease   Review of Systems  General Present- Weight Gain. Not Present- Appetite Loss, Chills, Fatigue, Fever, Night Sweats and Weight Loss. Skin Present- Dryness. Not Present- Change in Wart/Mole, Hives, Jaundice, New Lesions, Non-Healing Wounds, Rash and Ulcer. HEENT Present- Visual Disturbances. Not Present- Earache, Hearing Loss, Hoarseness, Nose Bleed, Oral Ulcers, Ringing in the Ears, Seasonal Allergies, Sinus Pain, Sore Throat, Wears glasses/contact lenses and Yellow Eyes. Cardiovascular Not Present- Chest Pain, Difficulty Breathing Lying Down, Leg Cramps, Palpitations, Rapid Heart Rate, Shortness of Breath and Swelling of Extremities. Gastrointestinal Present- Constipation. Not Present- Abdominal Pain, Bloating, Bloody Stool, Change in Bowel Habits, Chronic diarrhea, Difficulty Swallowing, Excessive gas, Gets full quickly at meals, Hemorrhoids, Indigestion, Nausea, Rectal Pain and Vomiting. Female Genitourinary Not Present- Frequency, Nocturia, Painful Urination, Pelvic Pain and Urgency. Musculoskeletal Not Present- Back Pain, Joint Pain, Joint Stiffness, Muscle Pain, Muscle Weakness and Swelling of Extremities. Neurological Not Present- Decreased Memory, Fainting, Headaches, Numbness, Seizures, Tingling, Tremor, Trouble walking and Weakness. Psychiatric Present- Anxiety and Depression. Not Present- Bipolar, Change in Sleep Pattern, Fearful and Frequent crying. Endocrine Present- Cold Intolerance and Excessive Hunger. Not Present- Hair Changes, Heat Intolerance, Hot flashes and New Diabetes. Hematology Not Present- Blood Thinners, Easy Bruising, Excessive bleeding, Gland problems, HIV and Persistent Infections.  Vitals  Weight: 166  lb Height: 62in Body Surface Area: 1.77 m Body Mass Index: 30.36 kg/m  Temp.: 98.57F  Pulse: 86 (Regular)  BP: 132/90(Sitting, Left Arm, Standard)  Physical Exam   GENERAL APPEARANCE Development:  normal Nutritional status: normal Gross deformities: none  SKIN Rash, lesions, ulcers: none Induration, erythema: none Nodules: none palpable  EYES Conjunctiva and lids: normal Pupils: equal and reactive Iris: normal bilaterally  EARS, NOSE, MOUTH, THROAT External ears: no lesion or deformity External nose: no lesion or deformity Hearing: grossly normal Due to Covid-19 pandemic, patient is wearing a mask.  NECK Symmetric: yes Trachea: midline Thyroid: Mildly enlarged thyroid gland, right lobe greater than left, slightly firm to palpation without dominant or discrete nodule. No tenderness.  CHEST Respiratory effort: normal Retraction or accessory muscle use: no Breath sounds: normal bilaterally Rales, rhonchi, wheeze: none  CARDIOVASCULAR Auscultation: regular rhythm, mild tachycardia Murmurs: none Pulses: radial pulse 2+ palpable Lower extremity edema: none  MUSCULOSKELETAL Station and gait: normal Digits and nails: no clubbing or cyanosis Muscle strength: grossly normal all extremities Range of motion: grossly normal all extremities Deformity: none  LYMPHATIC Cervical: none palpable Supraclavicular: none palpable  PSYCHIATRIC Oriented to person, place, and time: yes Mood and affect: normal for situation Judgment and insight: appropriate for situation    Assessment & Plan   GRAVES DISEASE (E05.00)  Patient is referred by her endocrinologist for surgical evaluation for thyroidectomy for definitive management of Graves' disease.  Patient provided with a copy of "The Thyroid Book: Medical and Surgical Treatment of Thyroid Problems", published by [employer], 16 pages. Book reviewed and explained to patient during visit today.  Patient has  approximately a 2 year history of Graves' disease. She is symptomatic. Symptoms are relatively well controlled on her current medical regimen of methimazole and atenolol. Given her history of mild dysphagia and globus sensation, and her history of ophthalmologic symptoms, the patient has elected to proceed with total thyroidectomy for definitive management.  We discussed total thyroidectomy. We discussed the risk and benefits of the procedure including the risk of injury to recurrent laryngeal nerves and parathyroid glands. We discussed the size and location of the surgical incision. We discussed the hospital stay to be anticipated. We discussed the postoperative recovery and return to work. We discussed the need for lifelong thyroid hormone replacement. The patient understands and wishes to proceed with surgery in the near future.  Due to the current Covid virus pandemic, the operating schedule is presently being limited to emergency surgery only. We hope to begin scheduling cases such as this within the next 2-3 weeks. Our scheduling department will contact the patient as soon as possible.  The risks and benefits of the procedure have been discussed at length with the patient. The patient understands the proposed procedure, potential alternative treatments, and the course of recovery to be expected. All of the patient's questions have been answered at this time. The patient wishes to proceed with surgery.  Armandina Gemma, MD [facility] Office: [phone]

## 2020-03-02 NOTE — Patient Instructions (Addendum)
DUE TO COVID-19 ONLY ONE VISITOR IS ALLOWED TO COME WITH YOU AND STAY IN THE WAITING ROOM ONLY DURING PRE OP AND PROCEDURE DAY OF SURGERY. THE 1 VISITOR  MAY VISIT WITH YOU AFTER SURGERY IN YOUR PRIVATE ROOM DURING VISITING HOURS ONLY!  YOU NEED TO HAVE A COVID 19 TEST ON: 03/07/20 @ 1:00 PM , THIS TEST MUST BE DONE BEFORE SURGERY,  COVID TESTING SITE [place] [address], IT IS ON THE RIGHT GOING OUT [address] APPROXIMATELY  2 MINUTES PAST [employer] ON THE RIGHT. ONCE YOUR COVID TEST IS COMPLETED,  PLEASE BEGIN THE QUARANTINE INSTRUCTIONS AS OUTLINED IN YOUR HANDOUT.                Miranda Adams    Your procedure is scheduled on: 03/10/20   Report to [facility] Main  Entrance   Report to admitting at: 6:30 AM     Call this number if you have problems the morning of surgery [phone]    Remember: Do not eat food or drink liquids :After Midnight.   BRUSH YOUR TEETH MORNING OF SURGERY AND RINSE YOUR MOUTH OUT, NO CHEWING GUM CANDY OR MINTS.                               You may not have any metal on your body including hair pins and              piercings  Do not wear jewelry, make-up, lotions, powders or perfumes, deodorant             Do not wear nail polish on your fingernails.  Do not shave  48 hours prior to surgery.    Do not bring valuables to the hospital. [place].  Contacts, dentures or bridgework may not be worn into surgery.  Leave suitcase in the car. After surgery it may be brought to your room.     Patients discharged the day of surgery will not be allowed to drive home. IF YOU ARE HAVING SURGERY AND GOING HOME THE SAME DAY, YOU MUST HAVE AN ADULT TO DRIVE YOU HOME AND BE WITH YOU FOR 24 HOURS. YOU MAY GO HOME BY TAXI OR UBER OR ORTHERWISE, BUT AN ADULT MUST ACCOMPANY YOU HOME AND STAY WITH YOU FOR 24 HOURS.  Name and phone number of your driver:  Special Instructions:  N/A              Please read over the following fact sheets you were given: _____________________________________________________________________         [facility] - Preparing for Surgery Before surgery, you can play an important role.  Because skin is not sterile, your skin needs to be as free of germs as possible.  You can reduce the number of germs on your skin by washing with CHG (chlorahexidine gluconate) soap before surgery.  CHG is an antiseptic cleaner which kills germs and bonds with the skin to continue killing germs even after washing. Please DO NOT use if you have an allergy to CHG or antibacterial soaps.  If your skin becomes reddened/irritated stop using the CHG and inform your nurse when you arrive at Short Stay. Do not shave (including legs and underarms) for at least 48 hours prior to the first CHG shower.  You may shave your face/neck. Please follow these instructions carefully:  1.  Shower with CHG Soap the night before surgery and the  morning of Surgery.  2.  If you choose to wash your hair, wash your hair first as usual with your  normal  shampoo.  3.  After you shampoo, rinse your hair and body thoroughly to remove the  shampoo.                           4.  Use CHG as you would any other liquid soap.  You can apply chg directly  to the skin and wash                       Gently with a scrungie or clean washcloth.  5.  Apply the CHG Soap to your body ONLY FROM THE NECK DOWN.   Do not use on face/ open                           Wound or open sores. Avoid contact with eyes, ears mouth and genitals (private parts).                       Wash face,  Genitals (private parts) with your normal soap.             6.  Wash thoroughly, paying special attention to the area where your surgery  will be performed.  7.  Thoroughly rinse your body with warm water from the neck down.  8.  DO NOT shower/wash with your normal soap after using and rinsing off  the CHG Soap.                9.   Pat yourself dry with a clean towel.            10.  Wear clean pajamas.            11.  Place clean sheets on your bed the night of your first shower and do not  sleep with pets. Day of Surgery : Do not apply any lotions/deodorants the morning of surgery.  Please wear clean clothes to the hospital/surgery center.  FAILURE TO FOLLOW THESE INSTRUCTIONS MAY RESULT IN THE CANCELLATION OF YOUR SURGERY PATIENT SIGNATURE_________________________________  NURSE SIGNATURE__________________________________  ________________________________________________________________________

## 2020-03-03 ENCOUNTER — Encounter (HOSPITAL_COMMUNITY): Payer: Self-pay

## 2020-03-03 ENCOUNTER — Other Ambulatory Visit: Payer: Self-pay

## 2020-03-03 ENCOUNTER — Encounter (HOSPITAL_COMMUNITY)
Admission: RE | Admit: 2020-03-03 | Discharge: 2020-03-03 | Disposition: A | Discharge status: Home / Self Care | Payer: No Typology Code available for payment source | Source: Ambulatory Visit | Attending: Surgery | Admitting: Surgery

## 2020-03-03 ENCOUNTER — Ambulatory Visit (HOSPITAL_COMMUNITY)
Admission: RE | Admit: 2020-03-03 | Discharge: 2020-03-03 | Disposition: A | Discharge status: Home / Self Care | Payer: No Typology Code available for payment source | Source: Ambulatory Visit | Attending: Anesthesiology | Admitting: Anesthesiology

## 2020-03-03 DIAGNOSIS — E079 Disorder of thyroid, unspecified: Secondary | ICD-10-CM | POA: Insufficient documentation

## 2020-03-03 HISTORY — DX: Thyrotoxicosis, unspecified without thyrotoxic crisis or storm: E05.90

## 2020-03-03 HISTORY — DX: Thyrotoxicosis with diffuse goiter without thyrotoxic crisis or storm: E05.00

## 2020-03-03 HISTORY — DX: Essential (primary) hypertension: I10

## 2020-03-03 LAB — CBC
HCT: 38.6 % (ref 36.0–46.0)
Hemoglobin: 12.7 g/dL (ref 12.0–15.0)
MCH: 29.7 pg (ref 26.0–34.0)
MCHC: 32.9 g/dL (ref 30.0–36.0)
MCV: 90.4 fL (ref 80.0–100.0)
Platelets: 276 10*3/uL (ref 150–400)
RBC: 4.27 MIL/uL (ref 3.87–5.11)
RDW: 11.7 % (ref 11.5–15.5)
WBC: 5.8 10*3/uL (ref 4.0–10.5)
nRBC: 0 % (ref 0.0–0.2)

## 2020-03-03 LAB — BASIC METABOLIC PANEL
Anion gap: 5 (ref 5–15)
BUN: 11 mg/dL (ref 6–20)
CO2: 26 mmol/L (ref 22–32)
Calcium: 9.2 mg/dL (ref 8.9–10.3)
Chloride: 108 mmol/L (ref 98–111)
Creatinine, Ser: 0.55 mg/dL (ref 0.44–1.00)
GFR, Estimated: >60 mL/min (ref >60)
Glucose, Bld: 101 mg/dL — ABNORMAL HIGH (ref 70–99)
Potassium: 4.5 mmol/L (ref 3.5–5.1)
Sodium: 139 mmol/L (ref 135–145)

## 2020-03-03 NOTE — Progress Notes (Signed)
COVID Vaccine Completed: NO Date COVID Vaccine completed: COVID vaccine manufacturer: [place]   PCP - NO Cardiologist -   Chest x-ray -  EKG -  Stress Test -  ECHO -  Cardiac Cath -  Pacemaker/ICD device last checked:  Sleep Study -  CPAP -   Fasting Blood Sugar -  Checks Blood Sugar _____ times a day  Blood Thinner Instructions: Aspirin Instructions: Last Dose:  Anesthesia review:   Patient denies shortness of breath, fever, cough and chest pain at PAT appointment   Patient verbalized understanding of instructions that were given to them at the PAT appointment. Patient was also instructed that they will need to review over the PAT instructions again at home before surgery.

## 2020-03-05 ENCOUNTER — Encounter (HOSPITAL_COMMUNITY): Payer: Self-pay | Admitting: Surgery

## 2020-03-07 ENCOUNTER — Other Ambulatory Visit (HOSPITAL_COMMUNITY)
Admission: RE | Admit: 2020-03-07 | Discharge: 2020-03-07 | Disposition: A | Discharge status: Home / Self Care | Payer: No Typology Code available for payment source | Source: Ambulatory Visit | Attending: Surgery | Admitting: Surgery

## 2020-03-07 DIAGNOSIS — Z20822 Contact with and (suspected) exposure to covid-19: Secondary | ICD-10-CM | POA: Diagnosis not present

## 2020-03-07 DIAGNOSIS — Z01812 Encounter for preprocedural laboratory examination: Secondary | ICD-10-CM | POA: Insufficient documentation

## 2020-03-08 LAB — SARS CORONAVIRUS 2 (TAT 6-24 HRS): SARS Coronavirus 2: NEGATIVE

## 2020-03-10 ENCOUNTER — Ambulatory Visit (HOSPITAL_COMMUNITY): Payer: No Typology Code available for payment source | Admitting: Certified Registered"

## 2020-03-10 ENCOUNTER — Ambulatory Visit (HOSPITAL_COMMUNITY)
Admission: RE | Admit: 2020-03-10 | Discharge: 2020-03-11 | Disposition: A | Discharge status: Home / Self Care | Payer: No Typology Code available for payment source | Attending: Surgery | Admitting: Surgery

## 2020-03-10 ENCOUNTER — Other Ambulatory Visit: Payer: Self-pay

## 2020-03-10 ENCOUNTER — Encounter (HOSPITAL_COMMUNITY): Payer: Self-pay | Admitting: Surgery

## 2020-03-10 ENCOUNTER — Encounter (HOSPITAL_COMMUNITY)
Admission: RE | Disposition: A | Discharge status: Home / Self Care | Payer: Self-pay | Source: Home / Self Care | Attending: Surgery

## 2020-03-10 DIAGNOSIS — E0500 Thyrotoxicosis with diffuse goiter without thyrotoxic crisis or storm: Secondary | ICD-10-CM | POA: Diagnosis present

## 2020-03-10 HISTORY — PX: THYROIDECTOMY: SHX17

## 2020-03-10 LAB — TYPE AND SCREEN
ABO/RH(D): B POS
Antibody Screen: NEGATIVE

## 2020-03-10 LAB — PREGNANCY, URINE: Preg Test, Ur: NEGATIVE

## 2020-03-10 SURGERY — THYROIDECTOMY
Anesthesia: General | Site: Neck

## 2020-03-10 MED ORDER — ACETAMINOPHEN 325 MG PO TABS
650.0000 mg | ORAL_TABLET | Freq: Four times a day (QID) | ORAL | Status: DC | PRN
  Administered 2020-03-10 – 2020-03-11 (×2): 650 mg via ORAL
  Filled 2020-03-10 (×2): qty 2

## 2020-03-10 MED ORDER — OXYCODONE HCL 5 MG PO TABS: 5.0000 mg | ORAL_TABLET | Freq: Once | ORAL | Status: DC | PRN

## 2020-03-10 MED ORDER — SUGAMMADEX SODIUM 200 MG/2ML IV SOLN
INTRAVENOUS | Status: DC | PRN
  Administered 2020-03-10: 150 mg via INTRAVENOUS

## 2020-03-10 MED ORDER — ONDANSETRON 4 MG PO TBDP: 4.0000 mg | ORAL_TABLET | Freq: Four times a day (QID) | ORAL | Status: DC | PRN

## 2020-03-10 MED ORDER — SODIUM CHLORIDE 0.45 % IV SOLN: INTRAVENOUS | Status: DC

## 2020-03-10 MED ORDER — MIDAZOLAM HCL 2 MG/2ML IJ SOLN
INTRAMUSCULAR | Status: DC | PRN
  Administered 2020-03-10: 2 mg via INTRAVENOUS

## 2020-03-10 MED ORDER — STERILE WATER FOR IRRIGATION IR SOLN
Status: DC | PRN
  Administered 2020-03-10: 1000 mL

## 2020-03-10 MED ORDER — FENTANYL CITRATE (PF) 250 MCG/5ML IJ SOLN
INTRAMUSCULAR | Status: AC
  Filled 2020-03-10: qty 5

## 2020-03-10 MED ORDER — LACTATED RINGERS IV SOLN: INTRAVENOUS | Status: DC

## 2020-03-10 MED ORDER — 0.9 % SODIUM CHLORIDE (POUR BTL) OPTIME
TOPICAL | Status: DC | PRN
  Administered 2020-03-10: 1000 mL

## 2020-03-10 MED ORDER — CHLORHEXIDINE GLUCONATE 0.12 % MT SOLN
15.0000 mL | Freq: Once | OROMUCOSAL | Status: AC
  Administered 2020-03-10: 15 mL via OROMUCOSAL

## 2020-03-10 MED ORDER — CHLORHEXIDINE GLUCONATE CLOTH 2 % EX PADS: 6.0000 | MEDICATED_PAD | Freq: Once | CUTANEOUS | Status: DC

## 2020-03-10 MED ORDER — OXYCODONE HCL 5 MG PO TABS
5.0000 mg | ORAL_TABLET | ORAL | Status: DC | PRN
  Administered 2020-03-10: 5 mg via ORAL
  Filled 2020-03-10 (×2): qty 1

## 2020-03-10 MED ORDER — ONDANSETRON HCL 4 MG/2ML IJ SOLN
INTRAMUSCULAR | Status: DC | PRN
  Administered 2020-03-10: 4 mg via INTRAVENOUS

## 2020-03-10 MED ORDER — MIDAZOLAM HCL 2 MG/2ML IJ SOLN
INTRAMUSCULAR | Status: AC
  Filled 2020-03-10: qty 2

## 2020-03-10 MED ORDER — ROCURONIUM BROMIDE 10 MG/ML (PF) SYRINGE
PREFILLED_SYRINGE | INTRAVENOUS | Status: DC | PRN
  Administered 2020-03-10: 60 mg via INTRAVENOUS
  Administered 2020-03-10 (×3): 20 mg via INTRAVENOUS

## 2020-03-10 MED ORDER — OXYCODONE HCL 5 MG/5ML PO SOLN: 5.0000 mg | Freq: Once | ORAL | Status: DC | PRN

## 2020-03-10 MED ORDER — PROPOFOL 10 MG/ML IV BOLUS
INTRAVENOUS | Status: DC | PRN
  Administered 2020-03-10: 50 mg via INTRAVENOUS
  Administered 2020-03-10: 150 mg via INTRAVENOUS

## 2020-03-10 MED ORDER — ORAL CARE MOUTH RINSE: 15.0000 mL | Freq: Once | OROMUCOSAL | Status: AC

## 2020-03-10 MED ORDER — FENTANYL CITRATE (PF) 250 MCG/5ML IJ SOLN
INTRAMUSCULAR | Status: DC | PRN
  Administered 2020-03-10 (×2): 50 ug via INTRAVENOUS
  Administered 2020-03-10: 100 ug via INTRAVENOUS

## 2020-03-10 MED ORDER — HYDROMORPHONE HCL 1 MG/ML IJ SOLN
0.2500 mg | INTRAMUSCULAR | Status: DC | PRN
  Administered 2020-03-10 (×2): 0.5 mg via INTRAVENOUS

## 2020-03-10 MED ORDER — HYDROMORPHONE HCL 1 MG/ML IJ SOLN
INTRAMUSCULAR | Status: AC
  Filled 2020-03-10: qty 2

## 2020-03-10 MED ORDER — CALCIUM CARBONATE ANTACID 500 MG PO CHEW: 2.0000 | CHEWABLE_TABLET | Freq: Two times a day (BID) | ORAL | 1 refills | Status: DC

## 2020-03-10 MED ORDER — TRAMADOL HCL 50 MG PO TABS
50.0000 mg | ORAL_TABLET | Freq: Four times a day (QID) | ORAL | Status: DC | PRN
  Administered 2020-03-10 – 2020-03-11 (×2): 50 mg via ORAL
  Filled 2020-03-10 (×2): qty 1

## 2020-03-10 MED ORDER — TRAMADOL HCL 50 MG PO TABS: 50.0000 mg | ORAL_TABLET | Freq: Four times a day (QID) | ORAL | 0 refills | Status: AC | PRN

## 2020-03-10 MED ORDER — CEFAZOLIN SODIUM-DEXTROSE 2-4 GM/100ML-% IV SOLN
2.0000 g | INTRAVENOUS | Status: AC
  Administered 2020-03-10: 2 g via INTRAVENOUS

## 2020-03-10 MED ORDER — HYDROMORPHONE HCL 1 MG/ML IJ SOLN: 1.0000 mg | INTRAMUSCULAR | Status: DC | PRN

## 2020-03-10 MED ORDER — DEXAMETHASONE SODIUM PHOSPHATE 10 MG/ML IJ SOLN
INTRAMUSCULAR | Status: DC | PRN
  Administered 2020-03-10: 10 mg via INTRAVENOUS

## 2020-03-10 MED ORDER — ACETAMINOPHEN 650 MG RE SUPP: 650.0000 mg | Freq: Four times a day (QID) | RECTAL | Status: DC | PRN

## 2020-03-10 MED ORDER — ATENOLOL 50 MG PO TABS
50.0000 mg | ORAL_TABLET | Freq: Every day | ORAL | Status: DC
  Administered 2020-03-10: 22:00:00 50 mg via ORAL
  Filled 2020-03-10: qty 1

## 2020-03-10 MED ORDER — ONDANSETRON HCL 4 MG/2ML IJ SOLN: 4.0000 mg | Freq: Four times a day (QID) | INTRAMUSCULAR | Status: DC | PRN

## 2020-03-10 MED ORDER — CEFAZOLIN SODIUM-DEXTROSE 2-4 GM/100ML-% IV SOLN
INTRAVENOUS | Status: AC
  Filled 2020-03-10: qty 100

## 2020-03-10 MED ORDER — CALCIUM CARBONATE 1250 (500 CA) MG PO TABS
2.0000 | ORAL_TABLET | Freq: Three times a day (TID) | ORAL | Status: DC
  Administered 2020-03-10 – 2020-03-11 (×2): 1000 mg via ORAL
  Filled 2020-03-10 (×2): qty 1

## 2020-03-10 MED ORDER — PROPOFOL 10 MG/ML IV BOLUS
INTRAVENOUS | Status: AC
  Filled 2020-03-10: qty 40

## 2020-03-10 SURGICAL SUPPLY — 29 items
ATTRACTOMAT 16X20 MAGNETIC DRP (DRAPES) ×2 IMPLANT
BLADE SURG 15 STRL LF DISP TIS (BLADE) ×1 IMPLANT
BLADE SURG 15 STRL SS (BLADE) ×1
CHLORAPREP W/TINT 26 (MISCELLANEOUS) ×2 IMPLANT
CLIP VESOCCLUDE MED 6/CT (CLIP) ×8 IMPLANT
CLIP VESOCCLUDE SM WIDE 6/CT (CLIP) ×10 IMPLANT
COVER SURGICAL LIGHT HANDLE (MISCELLANEOUS) ×2 IMPLANT
COVER WAND RF STERILE (DRAPES) ×2 IMPLANT
DERMABOND ADVANCED (GAUZE/BANDAGES/DRESSINGS) ×1
DERMABOND ADVANCED .7 DNX12 (GAUZE/BANDAGES/DRESSINGS) ×1 IMPLANT
DRAPE LAPAROTOMY T 98X78 PEDS (DRAPES) ×2 IMPLANT
DRAPE UTILITY XL STRL (DRAPES) ×2 IMPLANT
ELECT PENCIL ROCKER SW 15FT (MISCELLANEOUS) ×2 IMPLANT
ELECT REM PT RETURN 15FT ADLT (MISCELLANEOUS) ×2 IMPLANT
GAUZE 4X4 16PLY RFD (DISPOSABLE) ×2 IMPLANT
GLOVE SURG ORTHO LTX SZ8 (GLOVE) ×2 IMPLANT
GOWN STRL REUS W/TWL XL LVL3 (GOWN DISPOSABLE) ×4 IMPLANT
HEMOSTAT SURGICEL 2X4 FIBR (HEMOSTASIS) ×2 IMPLANT
ILLUMINATOR WAVEGUIDE N/F (MISCELLANEOUS) ×2 IMPLANT
KIT BASIN OR (CUSTOM PROCEDURE TRAY) ×2 IMPLANT
KIT TURNOVER KIT A (KITS) ×2 IMPLANT
PACK BASIC VI WITH GOWN DISP (CUSTOM PROCEDURE TRAY) ×2 IMPLANT
SHEARS HARMONIC 9CM CVD (BLADE) ×2 IMPLANT
SUT MNCRL AB 4-0 PS2 18 (SUTURE) ×2 IMPLANT
SUT VIC AB 3-0 SH 18 (SUTURE) ×4 IMPLANT
SYR BULB IRRIG 60ML STRL (SYRINGE) ×2 IMPLANT
TOWEL OR 17X26 10 PK STRL BLUE (TOWEL DISPOSABLE) ×2 IMPLANT
TOWEL OR NON WOVEN STRL DISP B (DISPOSABLE) ×2 IMPLANT
TUBING CONNECTING 10 (TUBING) ×2 IMPLANT

## 2020-03-10 NOTE — Anesthesia Postprocedure Evaluation (Signed)
Anesthesia Post Note  Patient: Miranda Adams  Procedure(s) Performed: TOTAL THYROIDECTOMY (N/A Neck)     Patient location during evaluation: PACU Anesthesia Type: General Level of consciousness: awake and alert Pain management: pain level controlled Vital Signs Assessment: post-procedure vital signs reviewed and stable Respiratory status: spontaneous breathing, nonlabored ventilation, respiratory function stable and patient connected to nasal cannula oxygen Cardiovascular status: blood pressure returned to baseline and stable Postop Assessment: no apparent nausea or vomiting Anesthetic complications: no   No complications documented.  Last Vitals:  Vitals:   03/10/20 1247 03/10/20 1343  BP: (!) 150/96 (!) 147/81  Pulse: 82 90  Resp: 20 18  Temp: 36.9 C 36.9 C  SpO2: 100% 97%    Last Pain:  Vitals:   03/10/20 1343  TempSrc: Oral  PainSc:                  Deadrick Stidd S

## 2020-03-10 NOTE — Anesthesia Preprocedure Evaluation (Signed)
Anesthesia Evaluation  Patient identified by MRN, date of birth, ID band Patient awake    Reviewed: Allergy & Precautions, H&P , NPO status , Patient's Chart, lab work & pertinent test results  Airway Mallampati: II  TM Distance: >3 FB Neck ROM: Full    Dental no notable dental hx.    Pulmonary neg pulmonary ROS, former smoker,    Pulmonary exam normal breath sounds clear to auscultation       Cardiovascular hypertension, Pt. on medications and Pt. on home beta blockers Normal cardiovascular exam Rhythm:Regular Rate:Normal     Neuro/Psych negative neurological ROS  negative psych ROS   GI/Hepatic negative GI ROS, Neg liver ROS,   Endo/Other  Hyperthyroidism   Renal/GU negative Renal ROS  negative genitourinary   Musculoskeletal negative musculoskeletal ROS (+)   Abdominal   Peds negative pediatric ROS (+)  Hematology negative hematology ROS (+)   Anesthesia Other Findings   Reproductive/Obstetrics negative OB ROS                             Anesthesia Physical Anesthesia Plan  ASA: II  Anesthesia Plan: General   Post-op Pain Management:    Induction: Intravenous  PONV Risk Score and Plan: 3 and Ondansetron, Dexamethasone, Midazolam and Treatment may vary due to age or medical condition  Airway Management Planned: Oral ETT  Additional Equipment:   Intra-op Plan:   Post-operative Plan: Extubation in OR  Informed Consent: I have reviewed the patients History and Physical, chart, labs and discussed the procedure including the risks, benefits and alternatives for the proposed anesthesia with the patient or authorized representative who has indicated his/her understanding and acceptance.     Dental advisory given  Plan Discussed with: CRNA and Surgeon  Anesthesia Plan Comments:         Anesthesia Quick Evaluation

## 2020-03-10 NOTE — Interval H&P Note (Signed)
History and Physical Interval Note:  03/10/2020 8:19 AM  Miranda Adams  has presented today for surgery, with the diagnosis of GRAVES DISEASE.  The various methods of treatment have been discussed with the patient and family. After consideration of risks, benefits and other options for treatment, the patient has consented to    Procedure(s): TOTAL THYROIDECTOMY (N/A) as a surgical intervention.    The patient's history has been reviewed, patient examined, no change in status, stable for surgery.  I have reviewed the patient's chart and labs.  Questions were answered to the patient's satisfaction.    Armandina Gemma, MD [facility] Office: [place]

## 2020-03-10 NOTE — Op Note (Signed)
Procedure Note  Pre-operative Diagnosis:  Graves' disease  Post-operative Diagnosis:  same  Surgeon:  Armandina Gemma, MD  Assistant:  none   Procedure:  Total thyroidectomy  Anesthesia:  General  Estimated Blood Loss:  minimal  Drains: none         Specimen: thyroid to pathology  Indications:  Patient is referred by Dr. Jacelyn Pi for surgical evaluation and management of Graves' disease. Patient was diagnosed with hyperthyroidism in 2019. Patient has become progressively symptomatic with tachycardia, fatigue, and blurry vision. She has had some intermittent mild dysphagia. Laboratory studies show a markedly suppressed TSH of 0.005 as of November 30, 2019. Patient has undergone a nuclear medicine radioactive iodine uptake scan as well as an ultrasound examination. Patient is currently taking methimazole and atenolol in anticipation of surgery. Due to the mild symptoms of dysphagia and the ophthalmologic symptoms, the patient has elected surgical treatment rather than radioactive iodine ablation.   Procedure Details: Procedure was done in OR #1 at the [facility] [place]. The patient was brought to the operating room and placed in a supine position on the operating room table. Following administration of general anesthesia, the patient was positioned and then prepped and draped in the usual aseptic fashion. After ascertaining that an adequate level of anesthesia had been achieved, a small Kocher incision was made with #15 blade. Dissection was carried through subcutaneous tissues and platysma.Hemostasis was achieved with the electrocautery. Skin flaps were elevated cephalad and caudad from the thyroid notch to the sternal notch. A [place]ner self-retaining retractor was placed for exposure. Strap muscles were incised in the midline and dissection was begun on the left side.  Strap muscles were reflected laterally.  Left thyroid lobe was moderately enlarged and firm.  The left lobe was  gently mobilized with blunt dissection. Superior pole vessels were dissected out and divided individually between small and medium ligaclips with the harmonic scalpel. The thyroid lobe was rolled anteriorly. Branches of the inferior thyroid artery were divided between small ligaclips with the harmonic scalpel. Inferior venous tributaries were divided between ligaclips. Both the superior and inferior parathyroid glands were identified and preserved on their vascular pedicles. The recurrent laryngeal nerve was identified and preserved along its course. The ligament of Gwenlyn Found was released with the electrocautery and the gland was mobilized onto the anterior trachea. Isthmus was mobilized across the midline. There was no significant pyramidal lobe present. Dry pack was placed in the left neck.  The right thyroid lobe was gently mobilized with blunt dissection. Right thyroid lobe was moderately enlarged and firm. Superior pole vessels were dissected out and divided between small and medium ligaclips with the Harmonic scalpel. Superior parathyroid was identified and preserved. Inferior venous tributaries were divided between medium ligaclips with the harmonic scalpel. The right thyroid lobe was rolled anteriorly and the branches of the inferior thyroid artery divided between small ligaclips. The right recurrent laryngeal nerve was identified and preserved along its course. Inferior parathyroid was identified and preserved. The ligament of Gwenlyn Found was released with the electrocautery. The right thyroid lobe was mobilized onto the anterior trachea and the remainder of the thyroid was dissected off the anterior trachea and the thyroid was completely excised. A suture was used to mark the right lobe. The entire thyroid gland was submitted to pathology for review.  The neck was irrigated with warm saline. Fibrillar was placed throughout the operative field. Strap muscles were approximated in the midline with interrupted 3-0  Vicryl sutures. Platysma was closed with  interrupted 3-0 Vicryl sutures. Skin was closed with a running 4-0 Monocryl subcuticular suture. Wound was washed and Dermabond was applied. The patient was awakened from anesthesia and brought to the recovery room. The patient tolerated the procedure well.   Armandina Gemma, MD [facility] Office: [phone]

## 2020-03-10 NOTE — Anesthesia Procedure Notes (Signed)
Procedure Name: Intubation Date/Time: 03/10/2020 8:41 AM Performed by: Pilar Grammes, CRNA Pre-anesthesia Checklist: Patient identified, Emergency Drugs available, Suction available, Patient being monitored and Timeout performed Patient Re-evaluated:Patient Re-evaluated prior to induction Oxygen Delivery Method: Circle system utilized Preoxygenation: Pre-oxygenation with 100% oxygen Induction Type: IV induction Ventilation: Mask ventilation without difficulty Laryngoscope Size: Miller and 2 Grade View: Grade I Tube type: Oral Tube size: 7.0 mm Number of attempts: 1 Airway Equipment and Method: Stylet Placement Confirmation: positive ETCO2,  ETT inserted through vocal cords under direct vision,  CO2 detector and breath sounds checked- equal and bilateral Secured at: 22 cm Tube secured with: Tape Dental Injury: Teeth and Oropharynx as per pre-operative assessment

## 2020-03-10 NOTE — Discharge Instructions (Signed)
[facility]  THYROID & PARATHYROID SURGERY:  POST-OP INSTRUCTIONS  Always review your discharge instruction sheet from the facility where your surgery was performed.  A prescription for pain medication may be given to you upon discharge.  Take your pain medication as prescribed.  If narcotic pain medicine is not needed, then you may take acetaminophen (Tylenol) or ibuprofen (Advil) as needed.  Take your usually prescribed medications unless otherwise directed.  If you need a refill on your pain medication, please contact our office during regular business hours.  Prescriptions cannot be processed by our office after 5 pm or on weekends.  Start with a light diet upon arrival home, such as soup and crackers or toast.  Be sure to drink plenty of fluids daily.  Resume your normal diet the day after surgery.  Most patients will experience some swelling and bruising on the chest and neck area.  Ice packs will help.  Swelling and bruising can take several days to resolve.   It is common to experience some constipation after surgery.  Increasing fluid intake and taking a stool softener (Colace) will usually help or prevent this problem.  A mild laxative (Milk of Magnesia or Miralax) should be taken according to package directions if there has been no bowel movement after 48 hours.  You have steri-strips and a gauze dressing over your incision.  You may remove the gauze bandage on the second day after surgery, and you may shower at that time.  Leave your steri-strips (small skin tapes) in place directly over the incision.  These strips should remain on the skin for 5-7 days and then be removed.  You may get them wet in the shower and pat them dry.  You may resume regular (light) daily activities beginning the next day (such as daily self-care, walking, climbing stairs) gradually increasing activities as tolerated.  You may have sexual intercourse when it is comfortable.  Refrain from  any heavy lifting or straining until approved by your doctor.  You may drive when you no longer are taking prescription pain medication, you can comfortably wear a seatbelt, and you can safely maneuver your car and apply brakes.  You should see your doctor in the office for a follow-up appointment approximately three weeks after your surgery.  Make sure that you call for this appointment within a day or two after you arrive home to insure a convenient appointment time.  WHEN TO CALL YOUR DOCTOR: -- Fever greater than 101.5 -- Inability to urinate -- Nausea and/or vomiting - persistent -- Extreme swelling or bruising -- Continued bleeding from incision -- Increased pain, redness, or drainage from the incision -- Difficulty swallowing or breathing -- Muscle cramping or spasms -- Numbness or tingling in hands or around lips  The clinic staff is available to answer your questions during regular business hours.  Please don't hesitate to call and ask to speak to one of the nurses if you have concerns.  Rosie Torrez, MD [facility] Office: [phone] 

## 2020-03-10 NOTE — Transfer of Care (Signed)
Immediate Anesthesia Transfer of Care Note  Patient: Miranda Adams  Procedure(s) Performed: TOTAL THYROIDECTOMY (N/A Neck)  Patient Location: PACU  Anesthesia Type:General  Level of Consciousness: alert  and patient cooperative  Airway & Oxygen Therapy: Patient connected to face mask oxygen  Post-op Assessment: Post -op Vital signs reviewed and stable and Patient moving all extremities X 4  Post vital signs: stable  Last Vitals:  Vitals Value Taken Time  BP 149/89 03/10/20 1041  Temp    Pulse 88 03/10/20 1042  Resp 24 03/10/20 1042  SpO2 100 % 03/10/20 1042  Vitals shown include unvalidated device data.  Last Pain:  Vitals:   03/10/20 0648  TempSrc:   PainSc: 0-No pain      Patients Stated Pain Goal: 4 (72/53/66 [id number])  Complications: No complications documented.

## 2020-03-11 ENCOUNTER — Encounter (HOSPITAL_COMMUNITY): Payer: Self-pay | Admitting: Surgery

## 2020-03-11 DIAGNOSIS — E0500 Thyrotoxicosis with diffuse goiter without thyrotoxic crisis or storm: Secondary | ICD-10-CM | POA: Diagnosis not present

## 2020-03-11 LAB — BASIC METABOLIC PANEL
Anion gap: 9 (ref 5–15)
BUN: 12 mg/dL (ref 6–20)
CO2: 23 mmol/L (ref 22–32)
Calcium: 8.3 mg/dL — ABNORMAL LOW (ref 8.9–10.3)
Chloride: 104 mmol/L (ref 98–111)
Creatinine, Ser: 0.6 mg/dL (ref 0.44–1.00)
GFR, Estimated: >60 mL/min (ref >60)
Glucose, Bld: 150 mg/dL — ABNORMAL HIGH (ref 70–99)
Potassium: 4.2 mmol/L (ref 3.5–5.1)
Sodium: 136 mmol/L (ref 135–145)

## 2020-03-11 MED ORDER — CALCIUM GLUCONATE-NACL 2-0.675 GM/100ML-% IV SOLN
2.0000 g | Freq: Once | INTRAVENOUS | Status: AC
  Administered 2020-03-11: 09:00:00 2000 mg via INTRAVENOUS
  Filled 2020-03-11: qty 100

## 2020-03-11 MED ORDER — CALCIUM CARBONATE ANTACID 500 MG PO CHEW: 2.0000 | CHEWABLE_TABLET | Freq: Three times a day (TID) | ORAL | 1 refills | Status: AC

## 2020-03-11 NOTE — Progress Notes (Signed)
1 Day Post-Op    CC:  Subjective: She is doing well this AM.  She prefers the tramadol to the oxycodone.  Her site looks good.  Objective: Vital signs in last 24 hours: Temp:  [97.5 F (36.4 C)-98.8 F (37.1 C)] 98.2 F (36.8 C) (02/25 0603) Pulse Rate:  [77-96] 80 (02/25 0603) Resp:  [13-20] 16 (02/25 0603) BP: (121-155)/(76-98) 137/91 (02/25 0603) SpO2:  [93 %-100 %] 100 % (02/25 0603) Last BM Date: 03/08/20 600 p.o. 950 IV 0 urine recorded BP 137/91, heart rate 80-92, Glucose 150 Calcium 8.3   Intake/Output from previous day: 02/24 0701 - 02/25 0700 In: 1550 [P.O.:600; I.V.:950] Out: 25 [Blood:25] Intake/Output this shift: No intake/output data recorded.  General appearance: alert, cooperative and no distress Throat: Thyroidectomy site looks good.  Some soreness swallowing. Resp: clear to auscultation bilaterally  Lab Results:  No results for input(s): WBC, HGB, HCT, PLT in the last 72 hours.  BMET Recent Labs    03/11/20 0423  NA 136  K 4.2  CL 104  CO2 23  GLUCOSE 150*  BUN 12  CREATININE 0.60  CALCIUM 8.3*   PT/INR No results for input(s): LABPROT, INR in the last 72 hours.  No results for input(s): AST, ALT, ALKPHOS, BILITOT, PROT, ALBUMIN in the last 168 hours.   Lipase     Component Value Date/Time   LIPASE 26 12/29/2015 1306     Prior to Admission medications   Medication Sig Start Date End Date Taking? Authorizing Provider  atenolol (TENORMIN) 50 MG tablet Take 50 mg by mouth daily. 01/12/20  Yes [provider]  calcium carbonate (TUMS) 500 MG chewable tablet Chew 2 tablets (400 mg of elemental calcium total) by mouth 2 (two) times daily. 03/10/20  Yes Armandina Gemma, MD  Multiple Vitamins-Minerals (MULTIVITAMIN WITH MINERALS) tablet Take 1 tablet by mouth daily.   Yes [provider]  traMADol (ULTRAM) 50 MG tablet Take 1-2 tablets (50-100 mg total) by mouth every 6 (six) hours as needed for moderate pain. 03/10/20  Yes  Armandina Gemma, MD    Medications: . atenolol  50 mg Oral QHS  . calcium carbonate  2 tablet Oral TID WC   . sodium chloride Stopped (03/10/20 1319)    Assessment/Plan   Graves' disease Total thyroidectomy 03/10/2020, Dr. Armandina Gemma  [id number] diet ID: [id number] pre op DVT: SCD's Follow up:  Dr. Harlow Asa   Plan: She is receiving 2 g of IV calcium gluconate this AM.  Once that is completed she will be able to go home.  We have increased her calcium carbonate at home to 2 tablets 3 times daily.  Follow-up appointment 03/23/2020 at 9:30 AM.    LOS: 0 days    JENNINGS,WILLARD 03/11/2020 Please see Amion

## 2020-03-11 NOTE — Progress Notes (Signed)
Patient was given discharge instructions, and all questions were answered.  Patient was stable for discharge and was taken to main exit by wheelchair.

## 2020-03-11 NOTE — Discharge Summary (Signed)
  Patient ID: Miranda Adams [id number] 29 y.o. 1990-05-22  03/10/2020  Discharge date and time: 03/11/2020  Admitting Physician: [place]ia  Discharge Physician: Nickola Major Oletha Tolson  Admission Diagnoses: Graves disease [E05.00] Patient Active Problem List   Diagnosis Date Noted  . Graves disease 03/10/2020  . Graves' disease 02/28/2020  . Status post primary low transverse cesarean section--NRFHR 08/23/2015  . Pre-eclampsia in third trimester 08/13/2015  . Hypertension in pregnancy, preeclampsia 08/12/2015     Discharge Diagnoses: Graves' disease Patient Active Problem List   Diagnosis Date Noted  . Graves disease 03/10/2020  . Graves' disease 02/28/2020  . Status post primary low transverse cesarean section--NRFHR 08/23/2015  . Pre-eclampsia in third trimester 08/13/2015  . Hypertension in pregnancy, preeclampsia 08/12/2015    Operations: Procedure(s): TOTAL THYROIDECTOMY  Admission Condition: good  Discharged Condition: good  Indication for Admission: Grave's disease  Hospital Course: Ms. Mcnelly presented for elective total thyroidectomy for Grave's disease with Dr. Harlow Asa on 03/10/2020.  She recovered well and was discharged the following day.  Her Calcium level dropped from 9.2 pre-op to 8.3 post-op so she was given IV calcium gluconate prior to discharge and prescribed calcium supplementation.  Consults: None  Significant Diagnostic Studies: None, Calcium labs as above  Treatments: surgery: Total thyroidectomy 03/10/2020 Dr. Harlow Asa  Disposition: Home  Patient Instructions:  Allergies as of 03/11/2020      Reactions   Iodine Anaphylaxis, Hives   Shellfish Allergy Anaphylaxis, Hives      Medication List    TAKE these medications   atenolol 50 MG tablet Commonly known as: TENORMIN Take 50 mg by mouth daily.   calcium carbonate 500 MG chewable tablet Commonly known as: Tums Chew 2 tablets (400 mg of elemental calcium total) by mouth 3  (three) times daily.   multivitamin with minerals tablet Take 1 tablet by mouth daily.   traMADol 50 MG tablet Commonly known as: ULTRAM Take 1-2 tablets (50-100 mg total) by mouth every 6 (six) hours as needed for moderate pain.       Activity: activity as tolerated Diet: regular diet Wound Care: keep wound clean and dry  Follow-up:  With Dr. Harlow Asa in 3 weeks.  Signed: [facility] [place]   03/11/2020, 8:43 AM

## 2020-03-12 LAB — SURGICAL PATHOLOGY

## 2020-11-26 DIAGNOSIS — Z20822 Contact with and (suspected) exposure to covid-19: Secondary | ICD-10-CM | POA: Diagnosis not present

## 2020-11-26 DIAGNOSIS — J069 Acute upper respiratory infection, unspecified: Secondary | ICD-10-CM | POA: Diagnosis not present

## 2020-11-30 DIAGNOSIS — J069 Acute upper respiratory infection, unspecified: Secondary | ICD-10-CM | POA: Diagnosis not present

## 2020-11-30 DIAGNOSIS — R509 Fever, unspecified: Secondary | ICD-10-CM | POA: Diagnosis not present

## 2020-11-30 DIAGNOSIS — R0989 Other specified symptoms and signs involving the circulatory and respiratory systems: Secondary | ICD-10-CM | POA: Diagnosis not present

## 2020-11-30 DIAGNOSIS — R519 Headache, unspecified: Secondary | ICD-10-CM | POA: Diagnosis not present

## 2021-01-02 DIAGNOSIS — E890 Postprocedural hypothyroidism: Secondary | ICD-10-CM | POA: Diagnosis not present

## 2021-01-02 DIAGNOSIS — N912 Amenorrhea, unspecified: Secondary | ICD-10-CM | POA: Diagnosis not present

## 2021-10-31 IMAGING — DX DG CHEST 2V
2 series · 2 of 2 positions shown · non-contrast
Comparison: None.

CLINICAL DATA: 29-year-old female with thyroid surgery pending

EXAM:
CHEST - 2 VIEW

[chest pa]
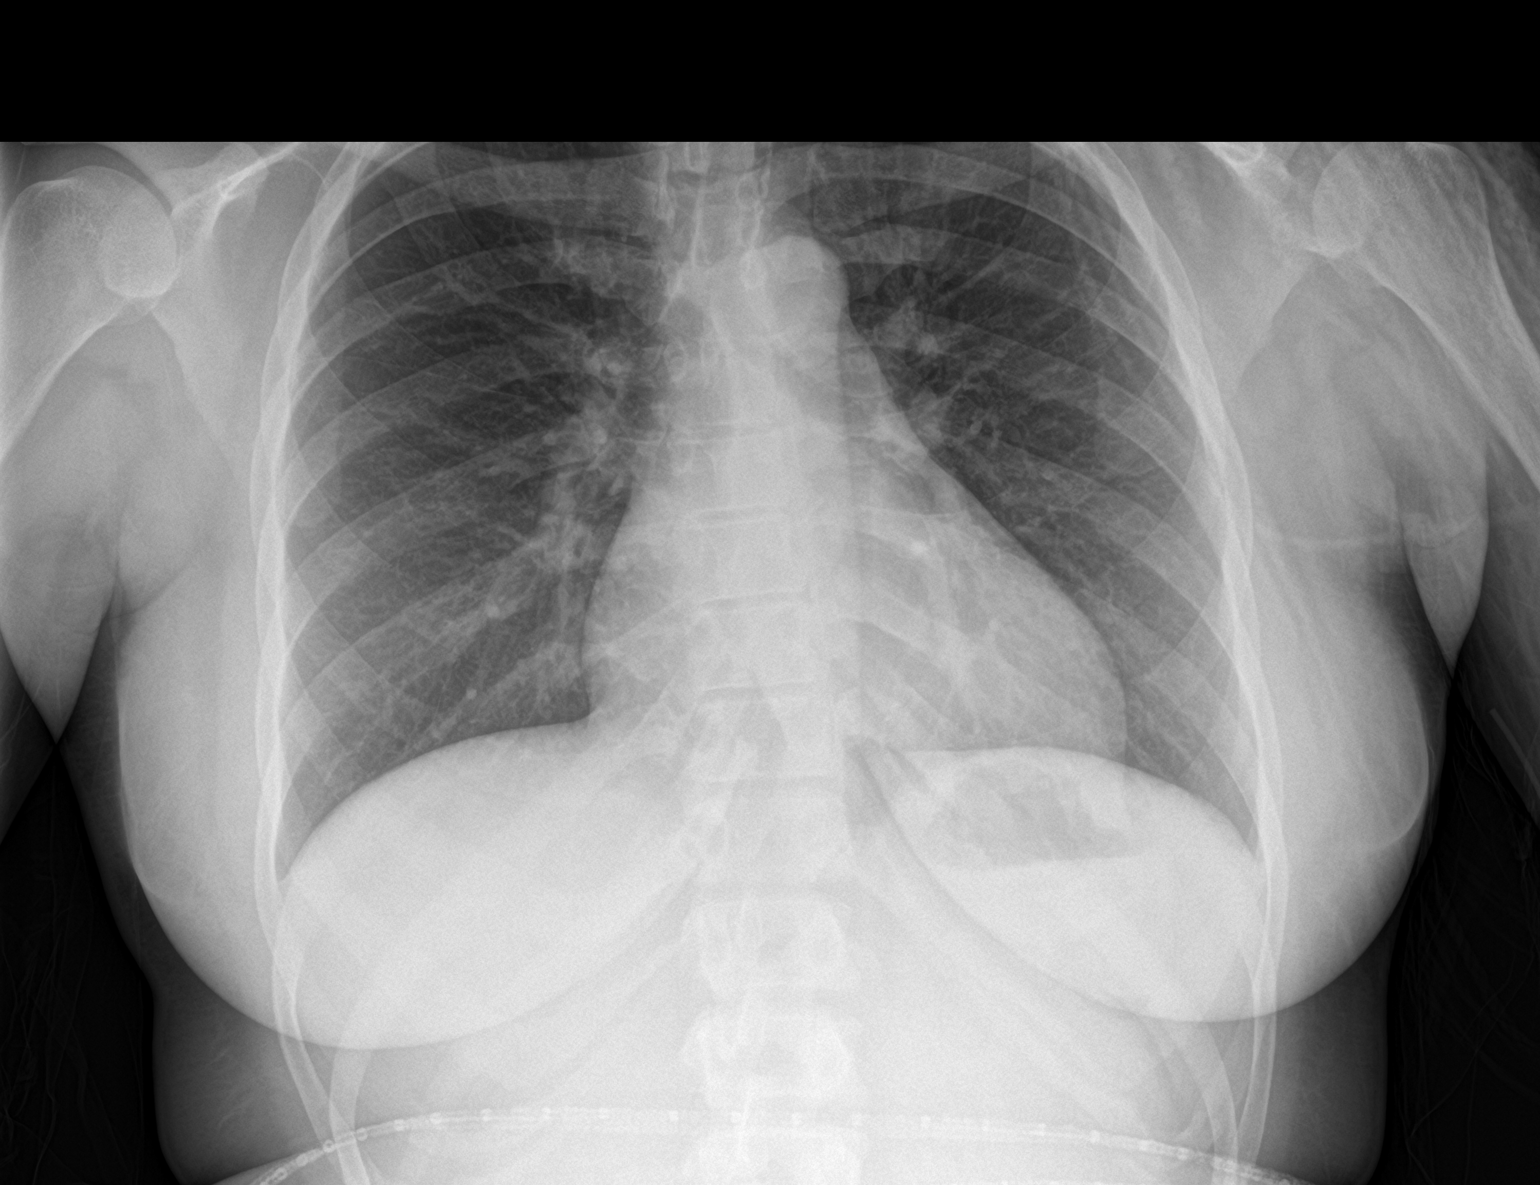

[chest lat]
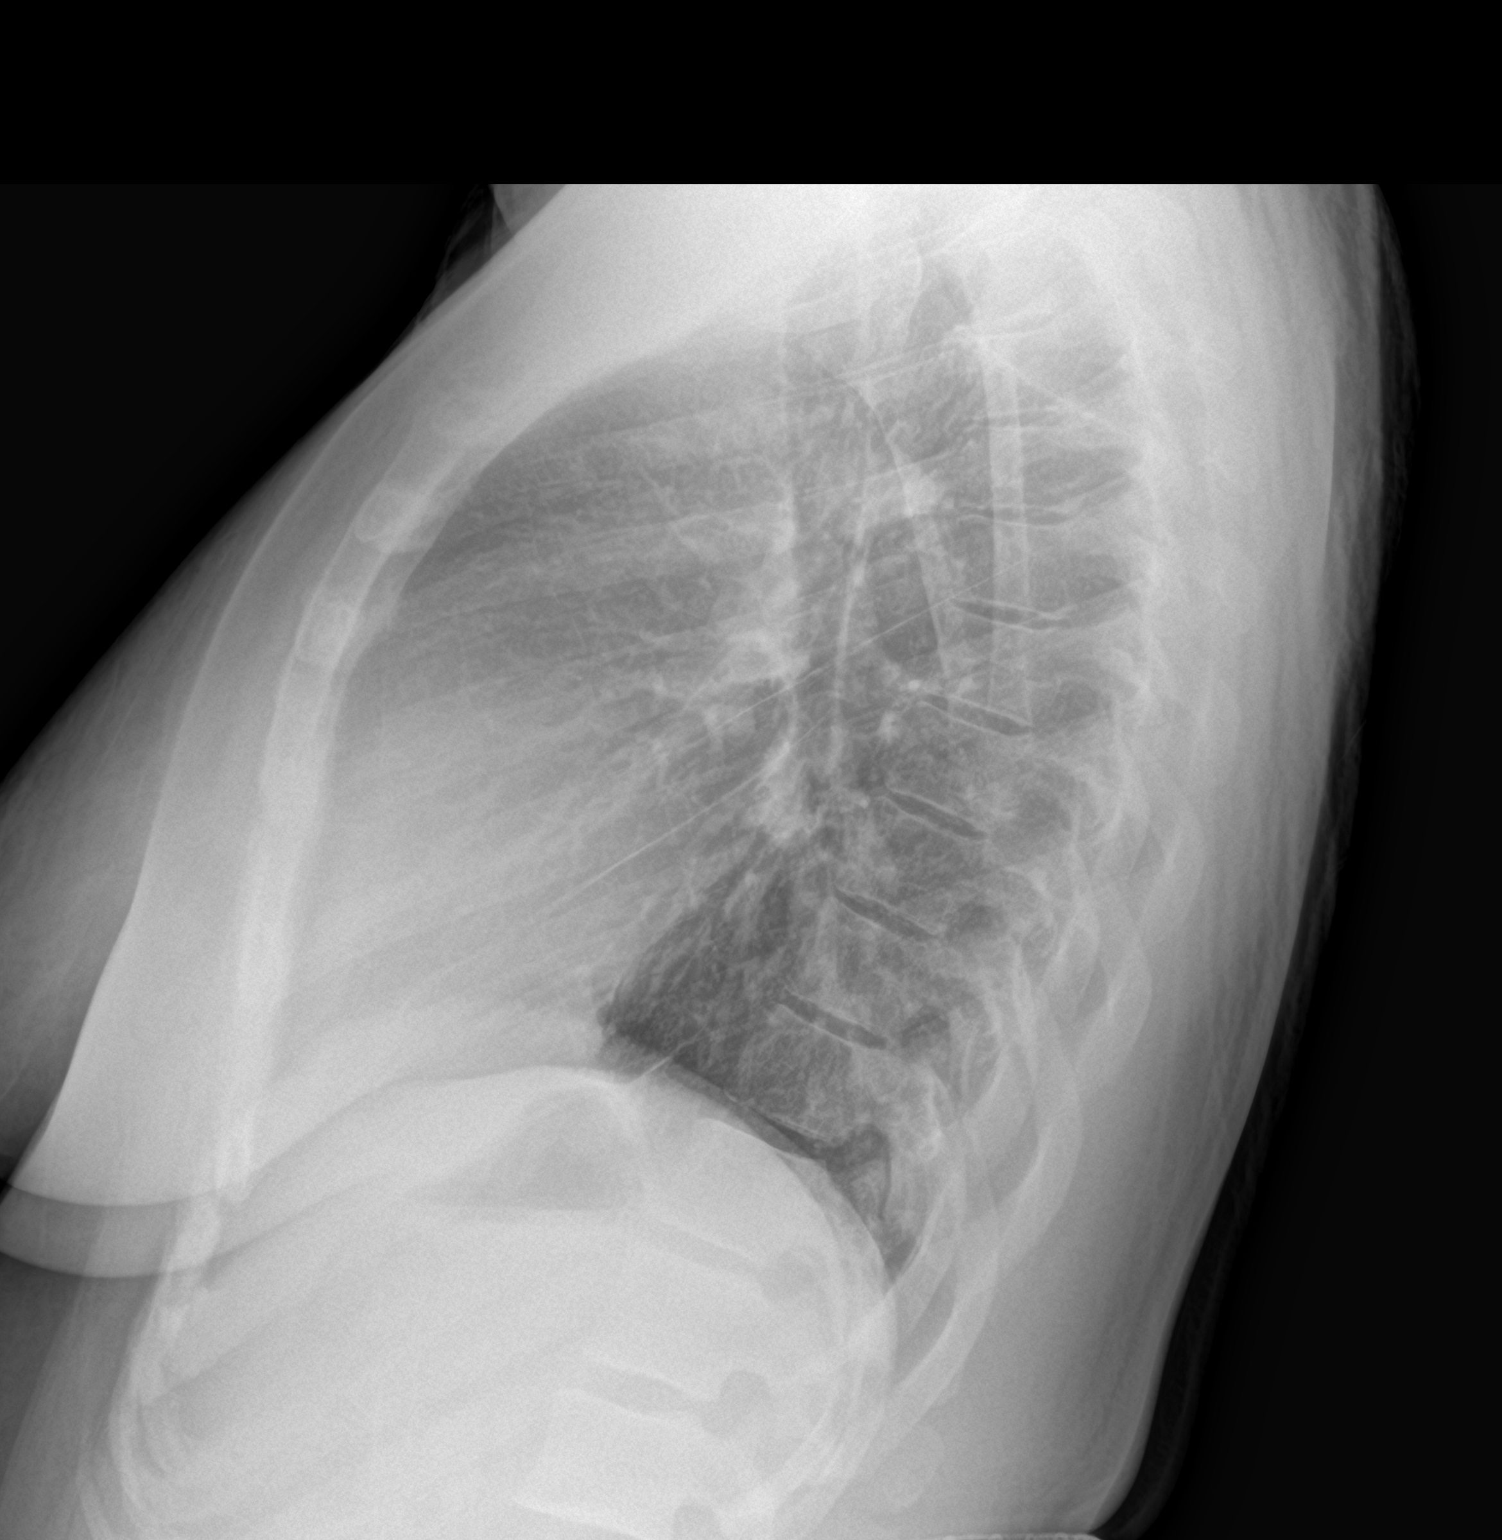

[2 of 2 positions shown; findings below may reference images not displayed]

FINDINGS: The heart size and mediastinal contours are within normal limits.
Both lungs are clear. The visualized skeletal structures are
unremarkable.
IMPRESSION: Negative for acute cardiopulmonary disease
